# Patient Record
Sex: Female | Born: 1935 | Smoking: Never smoker
Health system: Southern US, Community
[De-identification: ages and names within clinical notes are randomized; demographics above are authoritative.]

## PROBLEM LIST (undated history)

## (undated) DIAGNOSIS — E079 Disorder of thyroid, unspecified: Secondary | ICD-10-CM

## (undated) DIAGNOSIS — E785 Hyperlipidemia, unspecified: Secondary | ICD-10-CM

## (undated) DIAGNOSIS — E119 Type 2 diabetes mellitus without complications: Secondary | ICD-10-CM

## (undated) DIAGNOSIS — D649 Anemia, unspecified: Secondary | ICD-10-CM

## (undated) DIAGNOSIS — Z950 Presence of cardiac pacemaker: Secondary | ICD-10-CM

## (undated) DIAGNOSIS — R569 Unspecified convulsions: Secondary | ICD-10-CM

## (undated) DIAGNOSIS — I442 Atrioventricular block, complete: Secondary | ICD-10-CM

## (undated) DIAGNOSIS — I10 Essential (primary) hypertension: Secondary | ICD-10-CM

## (undated) HISTORY — DX: Presence of cardiac pacemaker: Z95.0

## (undated) HISTORY — PX: KNEE SURGERY: SHX244

## (undated) HISTORY — DX: Atrioventricular block, complete: I44.2

## (undated) HISTORY — PX: HIP SURGERY: SHX245

---

## 2011-03-07 ENCOUNTER — Inpatient Hospital Stay: Payer: Self-pay | Admitting: Internal Medicine

## 2011-03-08 DIAGNOSIS — I369 Nonrheumatic tricuspid valve disorder, unspecified: Secondary | ICD-10-CM

## 2013-05-15 ENCOUNTER — Ambulatory Visit: Payer: Self-pay | Admitting: Student

## 2013-05-15 ENCOUNTER — Inpatient Hospital Stay: Payer: Self-pay | Admitting: Internal Medicine

## 2013-05-15 LAB — T4, FREE: Free Thyroxine: 1.4 ng/dL (ref 0.76–1.46)

## 2013-05-15 LAB — BASIC METABOLIC PANEL
Chloride: 100 mmol/L (ref 98–107)
Co2: 27 mmol/L (ref 21–32)
Creatinine: 0.98 mg/dL (ref 0.60–1.30)
EGFR (African American): 60 — ABNORMAL LOW
Glucose: 128 mg/dL — ABNORMAL HIGH (ref 65–99)
Osmolality: 272 (ref 275–301)
Potassium: 4.1 mmol/L (ref 3.5–5.1)
Sodium: 133 mmol/L — ABNORMAL LOW (ref 136–145)

## 2013-05-15 LAB — APTT: Activated PTT: 33 secs (ref 23.6–35.9)

## 2013-05-15 LAB — MAGNESIUM: Magnesium: 1.5 mg/dL — ABNORMAL LOW

## 2013-05-15 LAB — URINALYSIS, COMPLETE
Blood: NEGATIVE
Glucose,UR: NEGATIVE mg/dL (ref 0–75)
Leukocyte Esterase: NEGATIVE
Ph: 6 (ref 4.5–8.0)
Squamous Epithelial: 1

## 2013-05-15 LAB — CBC
HGB: 11.9 g/dL — ABNORMAL LOW (ref 12.0–16.0)
MCH: 31.8 pg (ref 26.0–34.0)
Platelet: 264 10*3/uL (ref 150–440)
RDW: 12.2 % (ref 11.5–14.5)
WBC: 14.1 10*3/uL — ABNORMAL HIGH (ref 3.6–11.0)

## 2013-05-15 LAB — TSH: Thyroid Stimulating Horm: 2.34 u[IU]/mL

## 2013-05-15 LAB — PROTIME-INR
INR: 1
Prothrombin Time: 13.1 secs (ref 11.5–14.7)

## 2013-05-15 LAB — PHOSPHORUS: Phosphorus: 3.1 mg/dL (ref 2.5–4.9)

## 2013-05-15 LAB — SEDIMENTATION RATE: Erythrocyte Sed Rate: 86 mm/hr — ABNORMAL HIGH (ref 0–30)

## 2013-05-16 LAB — CBC WITH DIFFERENTIAL/PLATELET
Basophil #: 0.1 10*3/uL (ref 0.0–0.1)
Eosinophil #: 0.1 10*3/uL (ref 0.0–0.7)
HGB: 11.5 g/dL — ABNORMAL LOW (ref 12.0–16.0)
Lymphocyte %: 14.8 %
MCH: 32.5 pg (ref 26.0–34.0)
MCV: 96 fL (ref 80–100)
Monocyte #: 0.8 x10 3/mm (ref 0.2–0.9)
Monocyte %: 6.7 %
Neutrophil %: 76.7 %
Platelet: 271 10*3/uL (ref 150–440)
RBC: 3.54 10*6/uL — ABNORMAL LOW (ref 3.80–5.20)
RDW: 12.1 % (ref 11.5–14.5)

## 2013-05-16 LAB — BASIC METABOLIC PANEL
Anion Gap: 4 — ABNORMAL LOW (ref 7–16)
Calcium, Total: 8.9 mg/dL (ref 8.5–10.1)
EGFR (African American): 58 — ABNORMAL LOW
Glucose: 140 mg/dL — ABNORMAL HIGH (ref 65–99)
Osmolality: 271 (ref 275–301)
Sodium: 133 mmol/L — ABNORMAL LOW (ref 136–145)

## 2013-05-16 LAB — TROPONIN I: Troponin-I: <0.02 ng/mL

## 2013-05-16 LAB — LIPID PANEL
Cholesterol: 184 mg/dL (ref 0–200)
Triglycerides: 65 mg/dL (ref 0–200)

## 2013-05-16 LAB — MAGNESIUM: Magnesium: 1.8 mg/dL

## 2013-05-16 LAB — HEMATOCRIT: HCT: 26.2 % — ABNORMAL LOW (ref 35.0–47.0)

## 2013-05-17 LAB — TROPONIN I: Troponin-I: <0.02 ng/mL

## 2013-05-17 LAB — CBC WITH DIFFERENTIAL/PLATELET
Basophil #: 0 10*3/uL (ref 0.0–0.1)
Eosinophil #: 0.4 10*3/uL (ref 0.0–0.7)
Eosinophil %: 3.3 %
HCT: 23 % — ABNORMAL LOW (ref 35.0–47.0)
Lymphocyte %: 16.9 %
MCH: 33.4 pg (ref 26.0–34.0)
MCV: 97 fL (ref 80–100)
Monocyte #: 0.8 x10 3/mm (ref 0.2–0.9)
Monocyte %: 7.7 %
Neutrophil #: 7.7 10*3/uL — ABNORMAL HIGH (ref 1.4–6.5)
RBC: 2.38 10*6/uL — ABNORMAL LOW (ref 3.80–5.20)
RDW: 12.2 % (ref 11.5–14.5)
WBC: 10.7 10*3/uL (ref 3.6–11.0)

## 2013-05-17 LAB — BASIC METABOLIC PANEL
Calcium, Total: 8 mg/dL — ABNORMAL LOW (ref 8.5–10.1)
Chloride: 103 mmol/L (ref 98–107)
Co2: 25 mmol/L (ref 21–32)
Creatinine: 1.08 mg/dL (ref 0.60–1.30)
EGFR (Non-African Amer.): 46 — ABNORMAL LOW
Potassium: 4 mmol/L (ref 3.5–5.1)
Sodium: 134 mmol/L — ABNORMAL LOW (ref 136–145)

## 2013-05-18 LAB — BASIC METABOLIC PANEL
BUN: 19 mg/dL — ABNORMAL HIGH (ref 7–18)
Calcium, Total: 7.8 mg/dL — ABNORMAL LOW (ref 8.5–10.1)
Chloride: 104 mmol/L (ref 98–107)
Creatinine: 1.04 mg/dL (ref 0.60–1.30)
Glucose: 126 mg/dL — ABNORMAL HIGH (ref 65–99)
Osmolality: 270 (ref 275–301)
Potassium: 4 mmol/L (ref 3.5–5.1)

## 2013-05-18 LAB — PLATELET COUNT: Platelet: 178 10*3/uL (ref 150–440)

## 2013-05-18 LAB — HEMOGLOBIN
HGB: 6.7 g/dL — ABNORMAL LOW (ref 12.0–16.0)
HGB: 9 g/dL — ABNORMAL LOW (ref 12.0–16.0)

## 2013-05-19 LAB — BASIC METABOLIC PANEL
Anion Gap: 5 — ABNORMAL LOW (ref 7–16)
BUN: 13 mg/dL (ref 7–18)
Calcium, Total: 8.2 mg/dL — ABNORMAL LOW (ref 8.5–10.1)
Chloride: 103 mmol/L (ref 98–107)
Co2: 26 mmol/L (ref 21–32)
Creatinine: 0.97 mg/dL (ref 0.60–1.30)
EGFR (African American): >60
EGFR (Non-African Amer.): 52 — ABNORMAL LOW
Glucose: 150 mg/dL — ABNORMAL HIGH (ref 65–99)
Osmolality: 271 (ref 275–301)
Potassium: 3.6 mmol/L (ref 3.5–5.1)
Sodium: 134 mmol/L — ABNORMAL LOW (ref 136–145)

## 2013-05-19 LAB — PLATELET COUNT: Platelet: 196 10*3/uL (ref 150–440)

## 2013-05-19 LAB — HEMOGLOBIN: HGB: 8.5 g/dL — ABNORMAL LOW (ref 12.0–16.0)

## 2013-05-20 LAB — CBC WITH DIFFERENTIAL/PLATELET
BASOS ABS: 0 10*3/uL (ref 0.0–0.1)
Basophil %: 0.2 %
Eosinophil #: 0.4 10*3/uL (ref 0.0–0.7)
Eosinophil %: 3.9 %
HCT: 23.8 % — AB (ref 35.0–47.0)
HGB: 8.2 g/dL — AB (ref 12.0–16.0)
LYMPHS PCT: 16.1 %
Lymphocyte #: 1.7 10*3/uL (ref 1.0–3.6)
MCH: 31.4 pg (ref 26.0–34.0)
MCHC: 34.6 g/dL (ref 32.0–36.0)
MCV: 91 fL (ref 80–100)
MONO ABS: 0.8 x10 3/mm (ref 0.2–0.9)
Monocyte %: 8 %
NEUTROS PCT: 71.8 %
Neutrophil #: 7.5 10*3/uL — ABNORMAL HIGH (ref 1.4–6.5)
PLATELETS: 217 10*3/uL (ref 150–440)
RBC: 2.62 10*6/uL — ABNORMAL LOW (ref 3.80–5.20)
RDW: 13.3 % (ref 11.5–14.5)
WBC: 10.4 10*3/uL (ref 3.6–11.0)

## 2013-05-20 LAB — URINALYSIS, COMPLETE
Bacteria: NONE SEEN
Bilirubin,UR: NEGATIVE
Blood: NEGATIVE
Glucose,UR: 50 mg/dL (ref 0–75)
KETONE: NEGATIVE
Leukocyte Esterase: NEGATIVE
Nitrite: NEGATIVE
PROTEIN: NEGATIVE
Ph: 6 (ref 4.5–8.0)
Specific Gravity: 1.006 (ref 1.003–1.030)
Squamous Epithelial: 1
WBC UR: 1 /HPF (ref 0–5)

## 2013-05-20 LAB — BASIC METABOLIC PANEL
ANION GAP: 5 — AB (ref 7–16)
BUN: 13 mg/dL (ref 7–18)
CHLORIDE: 100 mmol/L (ref 98–107)
CO2: 29 mmol/L (ref 21–32)
Calcium, Total: 8.5 mg/dL (ref 8.5–10.1)
Creatinine: 0.89 mg/dL (ref 0.60–1.30)
GFR CALC NON AF AMER: 58 — AB
GLUCOSE: 153 mg/dL — AB (ref 65–99)
OSMOLALITY: 271 (ref 275–301)
POTASSIUM: 3.7 mmol/L (ref 3.5–5.1)
SODIUM: 134 mmol/L — AB (ref 136–145)

## 2013-05-20 LAB — PATHOLOGY REPORT

## 2013-05-25 ENCOUNTER — Inpatient Hospital Stay: Payer: Self-pay | Admitting: Internal Medicine

## 2013-05-25 LAB — COMPREHENSIVE METABOLIC PANEL
ALT: 46 U/L (ref 12–78)
Albumin: 2.3 g/dL — ABNORMAL LOW (ref 3.4–5.0)
Alkaline Phosphatase: 132 U/L — ABNORMAL HIGH
Anion Gap: 8 (ref 7–16)
BUN: 20 mg/dL — ABNORMAL HIGH (ref 7–18)
Bilirubin,Total: 0.7 mg/dL (ref 0.2–1.0)
CALCIUM: 8.4 mg/dL — AB (ref 8.5–10.1)
Chloride: 93 mmol/L — ABNORMAL LOW (ref 98–107)
Co2: 26 mmol/L (ref 21–32)
Creatinine: 1.08 mg/dL (ref 0.60–1.30)
EGFR (African American): 53 — ABNORMAL LOW
EGFR (Non-African Amer.): 46 — ABNORMAL LOW
Glucose: 97 mg/dL (ref 65–99)
Osmolality: 258 (ref 275–301)
Potassium: 3.7 mmol/L (ref 3.5–5.1)
SGOT(AST): 75 U/L — ABNORMAL HIGH (ref 15–37)
SODIUM: 127 mmol/L — AB (ref 136–145)
Total Protein: 7.2 g/dL (ref 6.4–8.2)

## 2013-05-25 LAB — URINALYSIS, COMPLETE
BILIRUBIN, UR: NEGATIVE
GLUCOSE, UR: NEGATIVE mg/dL (ref 0–75)
KETONE: NEGATIVE
Nitrite: NEGATIVE
PROTEIN: NEGATIVE
Ph: 5 (ref 4.5–8.0)
SPECIFIC GRAVITY: 1.014 (ref 1.003–1.030)
Squamous Epithelial: 10

## 2013-05-25 LAB — TROPONIN I

## 2013-05-25 LAB — CBC
HCT: 25.5 % — AB (ref 35.0–47.0)
HGB: 8.7 g/dL — AB (ref 12.0–16.0)
MCH: 31 pg (ref 26.0–34.0)
MCHC: 34.2 g/dL (ref 32.0–36.0)
MCV: 91 fL (ref 80–100)
Platelet: 439 10*3/uL (ref 150–440)
RBC: 2.81 10*6/uL — ABNORMAL LOW (ref 3.80–5.20)
RDW: 13.2 % (ref 11.5–14.5)
WBC: 7 10*3/uL (ref 3.6–11.0)

## 2013-05-25 LAB — RAPID INFLUENZA A&B ANTIGENS

## 2013-05-25 LAB — LIPASE, BLOOD: LIPASE: 772 U/L — AB (ref 73–393)

## 2013-05-26 LAB — LIPID PANEL
CHOLESTEROL: 121 mg/dL (ref 0–200)
HDL Cholesterol: 26 mg/dL — ABNORMAL LOW (ref 40–60)
Ldl Cholesterol, Calc: 62 mg/dL (ref 0–100)
TRIGLYCERIDES: 163 mg/dL (ref 0–200)
VLDL CHOLESTEROL, CALC: 33 mg/dL (ref 5–40)

## 2013-05-26 LAB — BASIC METABOLIC PANEL
Anion Gap: 4 — ABNORMAL LOW (ref 7–16)
BUN: 22 mg/dL — AB (ref 7–18)
CREATININE: 1.29 mg/dL (ref 0.60–1.30)
Calcium, Total: 8.5 mg/dL (ref 8.5–10.1)
Chloride: 95 mmol/L — ABNORMAL LOW (ref 98–107)
Co2: 30 mmol/L (ref 21–32)
EGFR (African American): 43 — ABNORMAL LOW
GFR CALC NON AF AMER: 37 — AB
GLUCOSE: 92 mg/dL (ref 65–99)
Osmolality: 262 (ref 275–301)
POTASSIUM: 3.9 mmol/L (ref 3.5–5.1)
SODIUM: 129 mmol/L — AB (ref 136–145)

## 2013-05-26 LAB — MAGNESIUM: MAGNESIUM: 1.4 mg/dL — AB

## 2013-05-27 LAB — CBC WITH DIFFERENTIAL/PLATELET
Basophil #: 0 10*3/uL (ref 0.0–0.1)
Basophil %: 0.7 %
EOS ABS: 0.2 10*3/uL (ref 0.0–0.7)
Eosinophil %: 2.9 %
HCT: 25.1 % — ABNORMAL LOW (ref 35.0–47.0)
HGB: 8.6 g/dL — AB (ref 12.0–16.0)
Lymphocyte #: 1.5 10*3/uL (ref 1.0–3.6)
Lymphocyte %: 24.3 %
MCH: 31.2 pg (ref 26.0–34.0)
MCHC: 34.3 g/dL (ref 32.0–36.0)
MCV: 91 fL (ref 80–100)
Monocyte #: 0.6 x10 3/mm (ref 0.2–0.9)
Monocyte %: 10.3 %
Neutrophil #: 3.8 10*3/uL (ref 1.4–6.5)
Neutrophil %: 61.8 %
Platelet: 441 10*3/uL — ABNORMAL HIGH (ref 150–440)
RBC: 2.75 10*6/uL — ABNORMAL LOW (ref 3.80–5.20)
RDW: 13.7 % (ref 11.5–14.5)
WBC: 6.1 10*3/uL (ref 3.6–11.0)

## 2013-05-27 LAB — COMPREHENSIVE METABOLIC PANEL
ALK PHOS: 103 U/L
ALT: 36 U/L (ref 12–78)
ANION GAP: 5 — AB (ref 7–16)
Albumin: 2.3 g/dL — ABNORMAL LOW (ref 3.4–5.0)
BUN: 21 mg/dL — AB (ref 7–18)
Bilirubin,Total: 0.6 mg/dL (ref 0.2–1.0)
CHLORIDE: 98 mmol/L (ref 98–107)
CREATININE: 1 mg/dL (ref 0.60–1.30)
Calcium, Total: 8.3 mg/dL — ABNORMAL LOW (ref 8.5–10.1)
Co2: 26 mmol/L (ref 21–32)
EGFR (Non-African Amer.): 50 — ABNORMAL LOW
GFR CALC AF AMER: 58 — AB
Glucose: 80 mg/dL (ref 65–99)
Osmolality: 261 (ref 275–301)
POTASSIUM: 3.7 mmol/L (ref 3.5–5.1)
SGOT(AST): 46 U/L — ABNORMAL HIGH (ref 15–37)
SODIUM: 129 mmol/L — AB (ref 136–145)
Total Protein: 6.8 g/dL (ref 6.4–8.2)

## 2013-05-27 LAB — MAGNESIUM: Magnesium: 1.9 mg/dL

## 2013-05-27 LAB — LIPASE, BLOOD: LIPASE: 950 U/L — AB (ref 73–393)

## 2013-05-29 LAB — BASIC METABOLIC PANEL
Anion Gap: 7 (ref 7–16)
BUN: 12 mg/dL (ref 7–18)
Calcium, Total: 7.7 mg/dL — ABNORMAL LOW (ref 8.5–10.1)
Chloride: 101 mmol/L (ref 98–107)
Co2: 23 mmol/L (ref 21–32)
Creatinine: 0.89 mg/dL (ref 0.60–1.30)
EGFR (African American): >60
EGFR (Non-African Amer.): 58 — ABNORMAL LOW
Glucose: 113 mg/dL — ABNORMAL HIGH (ref 65–99)
Osmolality: 263 (ref 275–301)
Potassium: 3.9 mmol/L (ref 3.5–5.1)
Sodium: 131 mmol/L — ABNORMAL LOW (ref 136–145)

## 2013-08-03 ENCOUNTER — Ambulatory Visit: Payer: Self-pay | Admitting: Unknown Physician Specialty

## 2013-08-05 ENCOUNTER — Ambulatory Visit: Payer: Self-pay | Admitting: Unknown Physician Specialty

## 2013-09-17 ENCOUNTER — Emergency Department: Payer: Self-pay | Admitting: Internal Medicine

## 2013-09-17 LAB — URINALYSIS, COMPLETE
BILIRUBIN, UR: NEGATIVE
Bacteria: NONE SEEN
Glucose,UR: NEGATIVE mg/dL (ref 0–75)
Ketone: NEGATIVE
Leukocyte Esterase: NEGATIVE
NITRITE: NEGATIVE
PH: 6 (ref 4.5–8.0)
Protein: NEGATIVE
Specific Gravity: 1.014 (ref 1.003–1.030)
Squamous Epithelial: 1
WBC UR: 2 /HPF (ref 0–5)

## 2013-09-18 LAB — URINE CULTURE

## 2013-10-05 DIAGNOSIS — E039 Hypothyroidism, unspecified: Secondary | ICD-10-CM | POA: Insufficient documentation

## 2013-10-05 DIAGNOSIS — M3390 Dermatopolymyositis, unspecified, organ involvement unspecified: Secondary | ICD-10-CM | POA: Insufficient documentation

## 2013-10-05 DIAGNOSIS — M3313 Other dermatomyositis without myopathy: Secondary | ICD-10-CM | POA: Insufficient documentation

## 2013-10-05 DIAGNOSIS — M1990 Unspecified osteoarthritis, unspecified site: Secondary | ICD-10-CM | POA: Insufficient documentation

## 2014-09-08 NOTE — Consult Note (Signed)
Admit Diagnosis:   FRACTURE OF HIP: Onset Date: 15-May-2013, Status: Active, Description: FRACTURE OF HIP    Hypothyroidism:    Diabetes:   Home Medications: Medication Instructions Status  levothyroxine 50 mcg (0.05 mg) oral tablet 1 tab(s) orally once a day Active   Lab Results: Thyroid:  28-Dec-14 13:47   Thyroid Stimulating Hormone 2.34 (0.45-4.50 (International Unit)  ----------------------- Pregnant patients have  different reference  ranges for TSH:  - - - - - - - - - -  Pregnant, first trimetser:  0.36 - 2.50 uIU/mL)  Thyroxine, Free 1.40 (Result(s) reported on 15 May 2013 at 03:08PM.)  Routine Chem:  28-Dec-14 13:47   BUN  25  Creatinine (comp) 0.98  Sodium, Serum  133  Potassium, Serum 4.1  Chloride, Serum 100  CO2, Serum 27  Calcium (Total), Serum 9.3  Anion Gap  6  Osmolality (calc) 272  eGFR (African American)  60  eGFR (Non-African American)  52 (eGFR values <20m/min/1.73 m2 may be an indication of chronic kidney disease (CKD). Calculated eGFR is useful in patients with stable renal function. The eGFR calculation will not be reliable in acutely ill patients when serum creatinine is changing rapidly. It is not useful in  patients on dialysis. The eGFR calculation may not be applicable to patients at the low and high extremes of body sizes, pregnant women, and vegetarians.)  Result Comment POTASSIUM/AST - Slight hemolysis, interpret results with  - caution.  Result(s) reported on 15 May 2013 at 03:08PM.  Phosphorus, Serum 3.1 (Result(s) reported on 15 May 2013 at 03:08PM.)  Magnesium, Serum  1.5 (1.8-2.4 THERAPEUTIC RANGE: 4-7 mg/dL TOXIC: > 10 mg/dL  -----------------------)  Routine UA:  28-Dec-14 13:47   Color (UA) Yellow  Clarity (UA) Clear  Glucose (UA) Negative  Bilirubin (UA) Negative  Ketones (UA) Negative  Specific Gravity (UA) 1.011  Blood (UA) Negative  pH (UA) 6.0  Protein (UA) Negative  Nitrite (UA) Negative  Leukocyte  Esterase (UA) Negative (Result(s) reported on 15 May 2013 at 02:36PM.)  RBC (UA) 2 /HPF  WBC (UA) 2 /HPF  Bacteria (UA) NONE SEEN  Epithelial Cells (UA) 1 /HPF (Result(s) reported on 15 May 2013 at 02:36PM.)  Routine Coag:  28-Dec-14 13:47   Prothrombin 13.1  INR 1.0 (INR reference interval applies to patients on anticoagulant therapy. A single INR therapeutic range for coumarins is not optimal for all indications; however, the suggested range for most indications is 2.0 - 3.0. Exceptions to the INR Reference Range may include: Prosthetic heart valves, acute myocardial infarction, prevention of myocardial infarction, and combinations of aspirin and anticoagulant. The need for a higher or lower target INR must be assessed individually. Reference: The Pharmacology and Management of the Vitamin K  antagonists: the seventh ACCP Conference on Antithrombotic and Thrombolytic Therapy. CWGYKZ.9935Sept:126 (3suppl): [id number] A HCT value >55% may artifactually increase the PT.  In one study,  the increase was an average of 25%. Reference:  "Effect on Routine and Special Coagulation Testing Values of Citrate Anticoagulant Adjustment in Patients with High HCT Values." American Journal of Clinical Pathology 2006;126:400-405.)  Activated PTT (APTT) 33.0 (A HCT value >55% may artifactually increase the APTT. In one study, the increase was an average of 19%. Reference: "Effect on Routine and Special Coagulation Testing Values of Citrate Anticoagulant Adjustment in Patients with High HCT Values." American Journal of Clinical Pathology 2006;126:400-405.)  Routine Hem:  28-Dec-14 13:47   WBC (CBC)  14.1  RBC (CBC)  3.74  Hemoglobin (CBC)  11.9  Hematocrit (CBC) 35.6  Platelet Count (CBC) 264 (Result(s) reported on 15 May 2013 at 02:48PM.)  MCV 95  MCH 31.8  MCHC 33.4  RDW 12.2  Erythrocyte Sed Rate  86 (Result(s) reported on 15 May 2013 at 03:48PM.)   Radiology Results:  Radiology  Results: XRay:    28-Dec-14 15:07, Femur Right  Femur Right  REASON FOR EXAM:    RLE pain, tender  COMMENTS:       PROCEDURE: DXR - DXR FEMUR RIGHT  - May 15 2013  3:07PM     CLINICAL DATA:  Right hip pain, no trauma    EXAM:  RIGHT FEMUR - 2 VIEW    COMPARISON:  Pelvic radiograph same date    FINDINGS:  Acutemildly angulated right femoral neck fracture is reidentified.  Degenerative changes are noted at the right knee. No other right  femoral fracture is identified. Femoral head appears properly  located. Vascular calcifications are noted.     IMPRESSION:  Acute right femoral neck fracture.      Electronically Signed    By: Conchita Paris M.D.    On: 05/15/2013 15:22         Verified By: Arline Asp, M.D.,    28-Dec-14 15:07, Pelvis AP Only  Pelvis AP Only  REASON FOR EXAM:    right hip pain  COMMENTS:       PROCEDURE: DXR - DXR PELVIS AP ONLY  - May 15 2013  3:07PM     CLINICAL DATA:  Right hip pain, no trauma    EXAM:  PELVIS - 1-2 VIEW    COMPARISON:  CT 03/07/11    FINDINGS:  There is an acute rightfemoral neck fracture with minimal  angulation. Mild deformity of the right superior and inferior pubic  rami appears stable. Vascular calcifications are noted. Femoral  heads appear properly located allowing for technique.     IMPRESSION:  Acute right femoral neck fracture.      Electronically Signed    By: Conchita Paris M.D.    On: 05/15/2013 15:22         Verified By: Arline Asp, M.D.,    28-Dec-14 15:07, Tibia And Fibula Right  Tibia And Fibula Right  REASON FOR EXAM:    RLE pain, tender  COMMENTS:       PROCEDURE: DXR - DXR TIBIA AND FIBULA RT (LOWER L  - May 15 2013  3:07PM     CLINICAL DATA:  Right lower extremity pain, no trauma    EXAM:  RIGHT TIBIA AND FIBULA - 2 VIEW    COMPARISON:  Right hip radiographs same date, please see dedicated  report    FINDINGS:  Tricompartmental degenerative change within the is  noted. No long  bone fracture of the tibia or fibula is identified. Vascular  calcifications are noted. No radiopaque foreign body.    IMPRESSION:  No tibial or fibular fracture.      Electronically Signed    By: Conchita Paris M.D.    On: 05/15/2013 15:23         Verified By: Arline Asp, M.D.,    No Known Allergies:   Nursing Flowsheets: **Vital Signs.:   28-Dec-14 19:40  Vital Signs Type Admission  Temperature Temperature (F) 99.7  Celsius 37.6  Temperature Source oral  Pulse Pulse 89  Respirations Respirations 18  Systolic BP Systolic BP 270  Diastolic BP (mmHg) Diastolic BP (mmHg) 65  Mean BP 83  Pulse  Ox % Pulse Ox % 95  Pulse Ox Activity Level  At rest  Oxygen Delivery Room Air/ 21 %    General Aspect 81 y/o pleasant female c/o of right hip pain.   Present Illness 81 y/o female who is c/o right hip pain for past 3-5 days. She had some baseline hip pain but not as severe. She is no unable to bear weight. Ambulates with walker at baseline. Has bilateral knee arthritis for which she receives shots by Dr. Jefm Bryant in [place]. There is no traumatic event associated with new onset of hip pain which she describes as sharp innature. Requires assistance for transfers since hip pain started. She speaks with heavy accent, understand everything and has daughters at bedside that help with communication.   Case History and Physical Exam:  Chief Complaint right hip pain   Past Medical Health Diabetes Mellitus, new onset Afib, hypothyroidism, gout, chronic anemia, ?blood clot versus carotid stenosis   Past Surgical History None   Family History Diabetes Mellitus   HEENT head atraumatic, EOM intact, hearing intact   Neck/Nodes Supple  No Adenopathy   Chest/Lungs non-labored breathing, no accessory muscle use   Breasts Not examined   Cardiovascular Irregular Rate and Rhythm   Abdomen Benign   Genitalia Not examined   Rectal Not examined   Musculoskeletal  RLE: Extremity WWP, SILT  DP/SP/sural/saphenous, EHL/FHL/TA/GCS intact. Hip ROM with severe pain, keeps extremity in external rotation and slight flexion. No open wounds, no cellulitis.   Neurological Grossly WNL   Skin Warm  WNL    Impression Right subcapital femoral neck fracture   Plan Patient has no onset Afib, no documentation or priorcardiac workup since [place]. Will need Cardiac clearance in AM prior to surgery. Consented for surgery. If clear will post with Dr. Marry Guan in AM.   - NWB RLE - pain control - hold chemical DVT ppx day of surgery - SCD and TED hose   Electronic Signatures: Harle Battiest (MD)  (Signed 28-Dec-14 20:54)  Authored: Health Issues, Significant Events - History, Home Medications, Labs, Radiology Results, Allergies, Vital Signs, General Aspect/Present Illness, History and Physical Exam, Impression/Plan   Last Updated: 28-Dec-14 20:54 by Harle Battiest (MD)

## 2014-09-08 NOTE — Consult Note (Signed)
CTSP with telemetry with 3 and 4 second pauses; pt with abrupt onset dizziness, nausea, appears pale but communicative.  BP initially 89 systolic, improved with IVF.  Will need to move to CCU in event needs TCP; give ivf bolus and has nausea meds to use PRN.  Consider dopamine if BP begins to fall again.  Off all rate limiting meds already.  Saw cardiology earlier today; will discuss with them.  cycle troponins and get stat hct  Electronic Signatures: Curtis SitesKlein, Beth Goodlin J (MD)  (Signed on 29-Dec-14 18:42)  Authored  Last Updated: 29-Dec-14 18:42 by Curtis SitesKlein, Jolicia Delira J (MD)

## 2014-09-08 NOTE — Consult Note (Signed)
Brief Consult Note: Diagnosis: Pre-op, AF with controlled rate. Echo shows normal LVF, mod TR, no other significant findings. Acceptable risk for hip surgery.   Patient was seen by consultant.   Consult note dictated.   Comments: REC  Agree with current therapy, defer further cardiac diagnostics, proceed with hip surgery.  Electronic Signatures: Marcina MillardParaschos, Hayle Parisi (MD)  (Signed 29-Dec-14 08:35)  Authored: Brief Consult Note   Last Updated: 29-Dec-14 08:35 by Marcina MillardParaschos, Tychelle Purkey (MD)

## 2014-09-08 NOTE — H&P (Signed)
PATIENT NAME:  Diana Braun, Diana Braun MR#:  [id number] DATE OF BIRTH:  Jul 20, 1922  DATE OF ADMISSION:  05/15/2013  PRIMARY CARE PHYSICIAN: Dr. Marcello FennelHande  REASON FOR ADMISSION:  Hip pain, leg pain, diagnosed with a hip fracture.   REFERRING PHYSICIAN: Dr. Scotty CourtStafford  HISTORY OF PRESENT ILLNESS: This is a very nice 81 year old female, who has history of diabetes, hypothyroidism, anemia, hypokalemia, gout, and chronic swelling of the lower extremities.  [place] and she lives there several months a year, and she comes back here the other half of the year and spends some time with her daughter. The last time she came back from [place] was in October. [place] she has a doctor, and the doctor has been treating her for her diabetes, hypothyroidism, etc. She has never been diagnosed with any heart conditions, although in the past she was told that there was a blood clot somewhere in her hear. She never got treated, never got any follow up, so she is uncertain of this information be accurate.   The patient comes today with a history of 3 days of hip pain. Apparently this is something that has been going on for much longer than that. She has been hurting in her knees for over 1 or 2 weeks. She states that back in October, she had an incident when she was pushing some boxes, and she hit her hip on that side, but since then she did not have any major problems, other than  some soreness. For the last 3 days she is having severe pain, 10 out of 10 in intensity, very bad, radiating to the knee on the right side. Complaining of some swelling of the lower extremities, and patient not being able to walk. Prior to that, she had been walking with a walker without major problems. The patient actually used to be very active. She used to work for [employer] in a [employer], and she was on her feet all her life.   At this moment, the patient has significant pain, and she is going to be  evaluated by Orthopedics for possible surgical procedure. The patient was found to be in atrial fibrillation, rate controlled. She was not aware of this. She was not taking any medications for it, for what it is going to be a difficult clearance until we have at least an echocardiogram to evaluate the contractility of her heart. She denies any chest pain with activity or shortness of breath with activity as an indication of angina. The patient is already taking a beta blocker, atenolol apparently, for what I am going to continue that medication along the surgical to prevent any cardiac complications for surgery. [facility] am [facility] to evaluate the patient in the morning, and to get a stat echocardiogram in the morning prior to proceeding with the surgical procedure.   REVIEW OF SYSTEMS: CONSTITUTIONAL: No fever, weight loss or weight gain.  EYES: No blurry vision, double vision.  EARS, NOSE, THROAT: No difficulty swallowing or tinnitus.  RESPIRATORY: No shortness of breath, cough, wheezing, hemoptysis.  CARDIOVASCULAR: No chest pain. No orthopnea. No arrhythmias. Positive edema of the lower extremities. No palpitations. No syncope.  GASTROINTESTINAL: No nausea, vomiting, abdominal pain, constipation, diarrhea.  GENITOURINARY: No dysuria, hematuria, changes in frequency.  GYNECOLOGIC: No breast masses.  ENDOCRINE: No polyuria, polydipsia, polyphagia, cold or heat intolerance.  HEMATOLOGIC AND LYMPHATIC: No anemia, easy bruising or bleeding.  SKIN: No rashes or petechiae.  NEUROLOGIC: No numbness, tingling or ataxia.  PSYCHIATRIC: No insomnia or nervousness.  MUSCULOSKELETAL: The patient has severe osteoarthritis of the knees, and she has been seeing Dr. Gavin Potters for this, and they are trying to make a decision about getting a knee replacement.   PAST MEDICAL HISTORY: 1.  Hypothyroidism.  2.  Chronic disease anemia.  3.  Chronic swelling of the lower extremities.  4.   Noninsulin-dependent diabetes.  5.  Gout.   PAST SURGICAL HISTORY:  Cataract removal.   ALLERGIES: No known drug allergies.   FAMILY HISTORY: The patient is not aware of any coronary artery disease, arrhythmias, or CHF in her family. No MI. No cancer. Her father died in an accident at a young age. Her mother and 2 brothers have diabetes.   SOCIAL HISTORY: The patient used to work for [employer] in [employer], always on her feet. She never smoked. Does not drink. Does not use any drugs. She moves back and forth from [place] with [place] family, she states 6 months here, 6 months there. The last time she came back was in October.   CURRENT MEDICATIONS: Aspirin once daily, unknown if 325 mg or 81 mg, lisinopril 5 mg once daily, metformin 500 mg twice daily, allopurinol 100 mg once daily, atenolol 25 mg once daily, ferrous sulfate 325 mg daily, Protonix 40 mg daily, Levothyroxine 50 mcg daily.   PHYSICAL EXAMINATION: VITAL SIGNS: Blood pressure 142/64, pulse 82, respirations 18, temperature 99, oxygen saturation 99% on room air.  GENERAL: The patient is alert, oriented x 3, in no acute distress. No respiratory distress. She speaks English with a heavy accident, but she understands most of what we are talking. Her daughter is here to help her with communication, although she does not need any help.  HEENT: Her pupils are equal and reactive. Extraocular movements are intact. Mucosa are dry. Anicteric sclerae. Pink conjunctivae. No oral lesions. No oropharyngeal exudates.  NECK: Supple. No JVD. No thyromegaly. No adenopathy. No carotid bruits. No rigidity.  CARDIOVASCULAR: Irregularly irregular, rate controlled atrial fibrillation. No murmurs, rubs or gallops. No displacement of PMI. No tenderness to palpation of the chest.  LUNGS: Clear, without any wheezing or crepitus. No use of accessory muscles.  ABDOMEN: Soft, nontender, nondistended. No hepatosplenomegaly. No masses. Bowel  sounds are positive.  EXTREMITIES:  Positive trace edema of the lower extremities. Pulses +2. Capillary refill less than 3.  SKIN: No rashes or petechiae.  VASCULAR: Pulses +2. Capillary refill less than 3.  LYMPHATIC: Negative for lymphadenopathy in neck or supraclavicular areas.  MUSCULOSKELETAL: The patient has external rotation of the right lower extremity. The patient also has significant tenderness at the level of the left hip. No significant deformity. No significant ecchymosis.  NEUROLOGIC: Cranial nerves II to XII intact. Strength is 5/5 upper extremities, 5/5 of left lower extremity, unable to move the right lower extremity due to her fracture.  PSYCHIATRIC: Significant for patient being alert, oriented x 3. No significant altered mental status or agitation. The patient is very cooperative.   GENITAL:  Exam negative for external lesions.  DATA: Glucose is 128, BUN 25, sodium is 133, potassium 4.1, magnesium 1.5. TSH 2.34. White count is 14,000, her hemoglobin is 11.9, platelet count 264. Urinalysis negative for urinary tract infection. INR is 1.   Her x-rays show an acute right femoral neck fracture. Chest x-ray showed mild cardiomegaly, prominence of interstitial markings, stable and nonspecific. EKG: Atrial fibrillation, no ST depression or elevation, rate  controlled, right bundle branch block, anterior fascicular block. We do not have any previous EKGs to compare.   ASSESSMENT AND PLAN: This is a very nice 81 year old female with history of hip fracture, diabetes, hypertension, hyperlipidemia, now new diagnosis atrial fibrillation.   1.  Hip fracture. The patient is admitted for evaluation of hip fracture. At this moment, I cannot clear her up for surgery because of the new diagnosis of atrial fibrillation. We do not know how long this has been going on. We do not have any history of the patient having this problem in the past. The family is not aware of that. She sees Dr. Marcello Fennel here in  the [place], and he has never mentioned anything, for what we are going to get a Cardiology evaluation and an echocardiogram in the morning. I do not think that she requires a stress test at this moment. It is hard to say that this is a finding related to ischemic disease, as the patient is able to walk distances and move around well, without any equivalent of angina. She walks with a walker, but prior to that she was a very active person.   I am going to put in a consultation for Cardiology, [facility], to see if they can see her early in the morning, and get an echocardiogram early in the morning as well. The patient is going to have a Foley catheter. She is going to have pain control, nausea control. The patient looks very dry. A small amount of IV fluids is going to be given. Chest x-ray shows some changes. They are consistent with chronic findings. No signs of fluid overload. No signs of CHF. We are going to give her a small amount of fluids. She is to be cautious.   Since the patient has atrial fibrillation, new onset, we are going to give a call to Cardiology to see if they can see her early in the morning for clearance, and we are going to do an echocardiogram.   The patient has orders for traction device and a Foley catheter.   2.  Atrial fibrillation, new onset. The patient is on a beta blocker right now, which we are going to continue through the operative setting to prevent any cardiac complications. Monitor under telemetry. Echocardiogram ordered for the morning. No anticoagulation indicated at this moment, as the patient is going to require surgery. The patient has hypertension, diabetes, and she is above 85, with a CHADS score of 3, for what we will consider doing chronic anticoagulation, unless she can be out of the rhythm.   3.  Diabetes is stable. Continue insulin sliding scale. Continue diabetic diet. Continue metformin.   4.  Hypertension. Seems to be stable. Continue treatment with  atenolol.   5.  Anemia is also stable. Continue iron.   Other medical problems are very stable.   6.  Gastrointestinal prophylaxis with proton pump inhibitor. Deep vein thrombosis prophylaxis with compression devices. Lovenox after surgery.   I spent about 50 minutes with this patient. Transfer care to Dr. Marcello Fennel.   ____________________________ Felipa Furnace, MD rsg:mr D: 05/15/2013 17:13:46 ET T: 05/15/2013 18:26:47 ET JOB#: [id number]  cc: Felipa Furnace, MD, <Dictator> Karlisha Mathena Juanda Chance MD ELECTRONICALLY SIGNED 05/16/2013 1:00

## 2014-09-08 NOTE — Consult Note (Signed)
PATIENT NAME:  Diana Braun, Diana Braun MR#:  [id number] DATE OF BIRTH:  August 02, 1922  DATE OF CONSULTATION:  05/16/2013  REFERRING PHYSICIAN:   CONSULTING PHYSICIAN:  Marcina MillardAlexander Hadyn Blanck, MD  PRIMARY CARE PHYSICIAN: Dr. Marcello FennelHande.   CHIEF COMPLAINT: Right hip pain.   REASON FOR CONSULTATION: Consultation requested for evaluation for preoperative cardiovascular evaluation and atrial fibrillation.   HISTORY OF PRESENT ILLNESS: The patient is an 81 year old Filipino lady, who speaks very little [place], daughter present to interpret. The patient has a history of diabetes without prior cardiac history. She has a 3-day history of right hip pain with radiation down to her right knee with some mild lower extremity swelling. X-ray was performed, which revealed an acute right femoral neck fracture. The patient was noted to be in atrial fibrillation with a controlled rate. She denied chest pain or shortness of breath. Echocardiogram was performed earlier today, which revealed normal left ventricular function with moderate tricuspid regurgitation without other significant valvular abnormalities.   PAST MEDICAL HISTORY:  1.  Diabetes.  2.  Hypothyroidism.  3.  Chronic anemia.  4.  History of gout.   MEDICATIONS: Aspirin 1 daily, lisinopril 5 mg daily, metformin 500 mg b.i.d., allopurinol 100 mg daily, atenolol 25 mg daily, ferrous sulfate 325 mg daily, Protonix 40 mg daily and levothyroxine 50 mcg daily.   SOCIAL HISTORY: The patient is a resident of the [place]. Her family is in [place]. She denies tobacco abuse.   FAMILY HISTORY: No immediate family history for coronary artery disease or myocardial infarction.   REVIEW OF SYSTEMS: CONSTITUTIONAL: No fever or chills. EYES: No blurry vision.  EARS: No hearing loss. RESPIRATORY: No shortness of breath. CARDIOVASCULAR: No chest pain. GASTROINTESTINAL: No nausea, vomiting, or diarrhea. GENITOURINARY: No dysuria or hematuria. ENDOCRINE: No polyuria or polydipsia.  MUSCULOSKELETAL: Right hip pain as described above. NEUROLOGICAL: No focal muscle weakness or numbness.  PSYCHOLOGICAL: No depression or anxiety.   PHYSICAL EXAMINATION:  VITAL SIGNS: Blood pressure 115/64, pulse 92, respirations 18, temperature 98.4 and pulse oximetry 94%.  HEENT: Pupils equal and reactive to light and accommodation.  NECK: Supple without thyromegaly.  LUNGS: Clear.  HEART: Normal JVP. Normal PMI. Irregular/ irregular rhythm. Normal S1, S2. No appreciable gallop, murmur, or rub.  ABDOMEN: Soft and nontender. Pulses were intact bilaterally.  MUSCULOSKELETAL: Normal muscle tone.  NEUROLOGIC: The patient is alert and oriented x 3. Motor and sensory both grossly intact.   IMPRESSION: An 81 year old female with history of diabetes. Otherwise, no prior history of myocardial infarction, congestive heart failure, chronic kidney disease or prior stroke. The patient would be an acceptable risk for surgery for right hip fracture. Echocardiogram earlier today revealed normal left ventricular function with moderate tricuspid regurgitation without significant other valvular abnormalities.   RECOMMENDATIONS:  1.  I agree with current therapy.  2.  Proceed with hip surgery as planned.  3.  Would defer further cardiac diagnostics at this time. ____________________________ Marcina MillardAlexander Diana Salazar, MD ap:aw D: 05/16/2013 08:33:03 ET T: 05/16/2013 08:58:47 ET JOB#: [id number]  cc: Marcina MillardAlexander Lincoln Ginley, MD, <Dictator> Marcina MillardALEXANDER Shanie Mauzy MD ELECTRONICALLY SIGNED 05/18/2013 8:41

## 2014-09-09 NOTE — Consult Note (Signed)
Brief Consult Note: Diagnosis: osteoarthritis right knee.   Patient was seen by consultant.   Recommend further assessment or treatment.   Comments: 81 year old female complains of right knee pain. Had right hip hemiarthroplasty in December by Dr Ernest PineHooten and was participating in rehabilitation facility until readmission for medical problems.  Says she had cortisone injection in knee in December.  Dr Marcello FennelHande is her primary MD.   Exam:  alert and comfortable.  Right knee with large medial joint line spurs and varus.  no effusion or reddness.  range of motion 10-95*   stable to stress.  neurovascular status good distally.   X-rays:  advanced osteoarthritis right knee with medial joint line narrowing and diffuse spurring.  Rx:  Brace        NSAIDs when off Lovenox        Too soon to reinject knee (3-4 months usual)        Follow up Dr Ernest PineHooten after discharge.  Electronic Signatures: Valinda HoarMiller, Aften Lipsey E (MD)  (Signed 11-Jan-15 14:28)  Authored: Brief Consult Note   Last Updated: 11-Jan-15 14:28 by Valinda HoarMiller, Graceanne Guin E (MD)

## 2014-09-09 NOTE — H&P (Signed)
PATIENT NAME:  Diana Braun, Diana Braun    CHIEF COMPLAINT: Altered mental status.   HISTORY OF PRESENT ILLNESS: Diana Braun is an 81 year old pleasant white female who has a history of diabetes mellitus, hypertension, anemia,  hypokalemia and had a recent right hip fracture, underwent hemiarthroplasty. The patient was discharged to skilled nursing facility on May 20, 2013. The patient was participating well in the therapy. Since Monday night the patient started to have multiple episodes of vomiting and diarrhea. This afternoon, patient was noted to be extremely lethargic, confused and concerning this, the patient is brought to the Emergency Department. Work-up in the Emergency Department, the patient has a urinary tract infection, mild elevation of the lipase of 772. The patient underwent a right upper quadrant ultrasound, which showed cholelithiasis, gallstones, showed gallbladder wall thickening with possible early cholecystitis.  The patient does not have any white blood cell count or elevated LFTs. Unable to obtain any history from the patient as the patient is extremely lethargic. The history is mainly obtained from the patient's daughter.   PAST MEDICAL HISTORY: 1.  A two day history of atrial fibrillation, not on any anticoagulation.  2.  Diabetes mellitus, on oral medication.  3.  Hypertension.  4.  Anemia  5.  Hypothyroidism.  6.  Chronic lower extremity swelling.   PAST SURGICAL HISTORY:  1.  Cataract removal.  2.  Right hip arthroplasty.   ALLERGIES: No known drug allergies.   HOME MEDICATIONS: 1.  Tramadol 50 mg 1 tablet every four hours as needed.  2.  Rocephin 1 gram IM daily for aspiration pneumonia.  3.  Reglan 5 mg 3 times a day.  4.  Phenergan 25 mg as needed.  5.  Protonix 40 mg 2 times a day.  6.  Oxycodone 5 mg  every four hours as needed.  7.  Metformin 1000 mg 2 times a day.  8.  Lovenox 40 mg daily.  9.  Levothyroxine 50 mcg once a day.  10.  Acetaminophen 650 mg every four hours as needed.   SOCIAL HISTORY: No history of smoking, drinking alcohol or using illicit drugs. The patient lives part of the time in the [place] and the time in the [place] with her daughter.   FAMILY HISTORY: Father died in an accident at young age. Mother and two brothers with diabetes mellitus.   REVIEW OF SYSTEMS: Could not be obtained from the patient, as the patient is in altered mental.   PHYSICAL EXAMINATION: GENERAL: Frail looking female, looks extremely lethargic, confused.  VITAL SIGNS: Temperature 98.2, pulse 88, blood pressure in 96/55, respiratory rate of 18, oxygen saturation 98% on 2 liters of oxygen.  HEENT: Head normocephalic, atraumatic. There is no sclerae icterus. Conjunctivae normal. Pupils equal and react to light. Extraocular movements are intact. Mucous membranes are dry. No pharyngeal erythema.  NECK: Supple. No lymphadenopathy. No JVD. No carotid bruit. No thyromegaly.  CHEST: Has no focal tenderness.  LUNGS:  Bilaterally clear to auscultation. Decreased breath sounds in the bilateral lower lobes.  HEART: S1, S2, regular, tachycardia.  ABDOMEN: Bowel sounds present. Soft, mildly discomfort, no Murphy's sign.  EXTREMITIES: No pedal edema. Pulses 2+.  NEUROLOGIC:  The patient is extremely lethargic. Is able to answer some of the questions. Could not tell the place and time. No apparent cranial nerve abnormalities.  Could not examine the motor and sensory.  SKIN: No rash or lesions.  MUSCULOSKELETAL: Could not examine.   LABORATORY DATA: Urinalysis:  2+ leukocyte esterase, WBC of 29, 1+ bacteria. Ultrasound of the abdomen: Cholelithiasis. Gallbladder wall is borderline thickened early cholecystitis must be of concern.   CBC: WBC of 11 hemoglobin 8.7, platelet count 459. Complete  metabolic panel: Alkaline phosphatase of 132, total bilirubin of 8.7, AST 75.   ASSESSMENT AND PLAN: Diana Braun comes to the Emergency Department with nausea, vomiting and altered mental status.  1.  Acute pancreatitis. Most likely secondary to cholelithiasis, gallstone induced.  The patient currently does not have any abdominal pain. However, continue with IV fluids and follow up.  2.  Questionable cholecystitis with elevated liver enzymes. The patient on does not have any elevated white blood cell count. We will keep the patient on meropenem  as the patient has been on Rocephin for aspiration pneumonia.  3.  Urinary tract infection.  Follow up with urine cultures. We will keep the meropenem, concerned about possible ESBL organisms.  4.  Altered mental status.  Most likely a combination of factors; infection, medication. We will hold all sedative medications. Continue with IV fluids. Treat the underlying infection.  5.  Recent right hip surgery. We will continue the physical therapy after the patient's mental status improves.  6.  Debility. We will involve the physical therapy, occupational therapy.  7.  Keep the patient on deep vein thrombosis prophylaxis with Lovenox.   TIME SPENT: 55 minutes.    ____________________________ Susa GriffinsPadmaja Atira Borello, MD pv:cc D: 05/25/2013 23:39:59 ET T: 05/26/2013 00:03:23 ET JOB#: [id number]  cc: Susa GriffinsPadmaja Renlee Floor, MD, <Dictator> Barbette ReichmannVishwanath Hande, MD Susa GriffinsPADMAJA Keirah Konitzer MD ELECTRONICALLY SIGNED 05/27/2013 21:22

## 2014-09-09 NOTE — Discharge Summary (Signed)
PATIENT NAME:  Diana KungVISAYA, Diana Braun MR#:  [id number] DATE OF BIRTH:  March 10, 1923  DATE OF ADMISSION:  05/15/2013 DATE OF DISCHARGE:    DIAGNOSES AT TIME OF DISCHARGE:  1.  Right hip fracture status post right hip hemiarthroplasty.  2.  A history of atrial fibrillation.  3.  Type 2 diabetes.  4.  Hypertension.  5.  Anemia.   CHIEF COMPLAINT:  Right hip pain.   HISTORY OF PRESENT ILLNESS:  Diana Braun is an 81 year old female with a history of type 2 diabetes, hypothyroidism, anemia, gout, who [place], presents to the ER brought by family complaining of right hip pain. The patient states that she hurt her back a few months back when she was pushing some boxes and then subsequently then subsequently hit her right hip on that side and did not think much of it but lately her pain has gradually gotten worse in the point that she is having difficulty ambulating. X-ray of the right hip showed an acute right angle femoral neck fracture and an EKG showed evidence of atrial fibrillation that was rate controlled with right bundle branch block and anterior fascicular block.   PHYSICAL EXAMINATION:  GENERAL:  She was in significant pain, blood pressure 114/64, pulse was 82, respirations 18, temperature 99, O2 sat 99% on room air.  HEENT:  NCAT.  NECK:  Supple. No JVD.  HEART:  S1, S2, irregular rhythm.  LUNGS:  Clear.  ABDOMEN:  Soft, nontender.  EXTREMITIES:  Trace edema.  NEUROLOGIC:  Alert and oriented x 3. No obvious focal signs.   The patient had external rotation of the right lower extremity with significant tenderness at the level of the right hip. The patient was seen in consultation by Dr. Ernest PineHooten and was also seen in consultation by cardiologist, Dr. Darrold JunkerParaschos. She underwent right hip hemiarthroplasty on 05/16/2013 and postoperatively had an episode of sinus pauses. She was subsequently moved to the CCU and with fluids, her symptoms resolved. She had episodic vomiting as well and this also  improved. Her pain was kept under control. She was seen by Physical Therapy and she was discharged in stable condition on the following medications.   DISCHARGE MEDICATIONS:  1.  Oxycodone 5 mg every 4 hours as needed. 2.  Enoxaparin 40 mg subcutaneous every 24 hours 14 days.  3.  Levothyroxine 50 mcg once a day.  4.  Pantoprazole 40 mg b.i.d. 5.  Docusate sodium 100 mg b.i.d. 6.  Metformin 5 mg p.o. b.i.d. with meals.   DISCHARGE INSTRUCTIONS:  The patient was advised to go to rehab and will follow up with Dr. Ernest PineHooten in 1 to 2 weeks' time and also follow up with me, Dr. Marcello FennelHande, in 2 weeks.   TOTAL TIME SPENT IN DISCHARGING THIS PATIENT:  35 minutes.  ____________________________ Diana ReichmannVishwanath Seila Liston, MD vh:jm D: 05/20/2013 13:20:18 ET T: 05/20/2013 13:43:08 ET JOB#: [id number]  cc: Diana ReichmannVishwanath Chantal Worthey, MD, <Dictator> Diana ReichmannVISHWANATH Braleigh Massoud MD ELECTRONICALLY SIGNED 05/20/2013 18:02

## 2014-09-09 NOTE — Consult Note (Signed)
PATIENT NAME:  Diana Braun, Diana Braun MR#:  [id number] DATE OF BIRTH:  26-Jul-1922  DATE OF CONSULTATION:  05/26/2013  CONSULTING PHYSICIAN:  Adella Hare, MD  REPORT OF CONSULTATION:  This this 81 year old female was admitted emergently to the hospital due to protracted nausea and vomiting, and she presents for General Surgery consultation due to a chief complaint of vomiting. She reports that 3 days ago, she began to have some vomiting. She reports she had been having some coughing, and coughing up some productive sputum, and her throat was irritating her. She thinks that led to vomiting. Did vomit several times between 3 days ago and 1 day ago. Also has had some diarrhea. She reports no associated chills or fever. She reports no abdominal or back pain. She came into the Emergency Room, where she was initially evaluated by Emergency Room staff, and did have an abnormally elevated lipase. Also had ultrasound demonstrating gallstones and slight thickening of the gallbladder wall. The patient was admitted. Was placed on intravenous meropenem, and has been kept n.p.o. She reports that she is thirsty, asking for water. She is not having any subsequent nausea or vomiting since admission. She reports no abdominal pain, and asking if she can go home.   PAST MEDICAL HISTORY DOES INCLUDE: 1.  Non-insulin-dependent diabetes mellitus.  2.  Hypertension.  3.  Hypothyroidism.  4.  Anemia. 5.  Recent right hip fracture, which had a hemiarthroplasty. Had moved to a rehab facility, where she was having physical therapy, beginning to walk some and, according to the admission note, she had had 2 days of atrial fibrillation.   PREVIOUS SURGERIES:  1.  She has had bilateral cataract surgery, and reports having implants on each side. 2.  Right hip arthroplasty.   MEDICATIONS INCLUDE:  1.  Acetaminophen 2 tablets every 4 hours as needed for pain. 2.  Levothyroxine 50 mcg daily. 3.  Lovenox 40 mg injection daily.  4.   Metformin 1000 mg 2 times per day. 5.  Oxycodone 5 mg q. 4 hours p.r.n. for pain. 6.  Pantoprazole 40 mg 2 times per day. 7.  Promethazine 25 mg injection p.r.n. for nausea and vomiting. 8.  Reglan 5 mg 3 times a day. 9.  Recent Rocephin 1 gram injected daily. 10.  Tramadol 50 mg every 4 hours as needed for pain.   DRUG ALLERGIES: None known.   SOCIAL HISTORY: She is originally from [place] and spent some of her time in [place] some of her time in the [place].   She does not smoke. Does not drink any alcohol.   FAMILY HISTORY: Positive for diabetes.   REVIEW OF SYSTEMS: She reports a limited degree of ambulation. Recently has been undergoing physical therapy, and beginning to walk some with a walker. Reports improvement with respect to her incision of the right hip. She reports minimal degree of pain at that site. She does report a recent productive cough. Has had some mild degree of hypoxia. She reports minimal degree of shortness of breath. Has been placed on oxygen, and reports breathing comfortably at present. She reports no swollen glands. No difficulty swallowing. No chest pains. No abdominal pain. No back pain. Has had some loose bowel movements. She reports she is voiding satisfactorily. Has had some minimal degree of swelling of her right leg and, according to records, she was having some lethargy and confusion at the time of admission. Review of systems otherwise negative.   PHYSICAL EXAMINATION: GENERAL: She is awake  and alert.  VITAL SIGNS: Temperature is 98.3, pulse 90, respirations 18, blood pressure 127/68, pulse ox 96% on 2 liters of oxygen.  SKIN: Warm and dry, without rash or jaundice.  HEENT: Pupils were uneven, somewhat larger on the left side, related to iridectomy. Pharynx appears somewhat dry. There appears to be some mild degree of white discoloration of her tongue.  NECK: With no palpable mass.  RESPIRATORY: Lung sounds are clear. No current  respiratory distress.  HEART: Regular rhythm, S1 and S2, without murmur.  ABDOMEN: Soft, flat and nontender, with no palpable mass. Dressing from right hip was removed. Dressing has been on since January 3, and wound appears to be dry, with no redness. Minimal degree of slight yellowish discoloration of the skin.  EXTREMITIES: No dependent edema.  NEUROLOGIC: Awake, alert and oriented, moving all extremities.   CLINICAL DATA: Her BUN on admission was 20, and sodium of 127. Liver panel with albumin of 2.3, total bilirubin 0.7, and alkaline phosphatase 132. Her serum magnesium today is 1.4. Her lipase on admission was 772.   Chest x-ray with some evidence of emphysematous change, but no pulmonary infiltrate.   She had ultrasound of the abdomen, which demonstrated multiple gallstones, some 3 mm thickness of the gallbladder wall. No pericholecystic fluid. The pancreas was not reported as being seen.   IMPRESSION: 1.  Recent episodes of nausea, vomiting, diarrhea. 2.  Elevated lipase.  3.  Cholecystitis, cholelithiasis.  4.  Productive cough. 5.  Hyperalbuminemia. 6.  Recent hip fracture.   I discussed this with her in detail. I think it is unclear whether she actually has pancreatitis or possibly her lipase could be elevated due to episodes of vomiting. Discussed that if a patient has biliary pancreatitis. There is risk of recurrence, and there is a role for laparoscopic cholecystectomy.  She would prefer not to have any surgery, if at all possible.   I think it would be prudent to repeat her lipase tomorrow to see if that returns to normal.   I suggest restart her on a clear liquid diet, and I have ordered that. I think she could slowly advance her diet as tolerated. Probably it would be good to have her on a low-fat diet for the first 30 days. If she tolerates her diet satisfactorily, may be able to return home or to the rehab facility.   As noted, I have discussed the potential role of  surgery, but seeing there is an absence of abdominal pain, it is unclear whether she has had pancreatitis or not.   Does not appear to need any antibiotics at present related to her gallbladder or pancreas or abdomen.    ____________________________ Shela CommonsJ. Renda RollsWilton Smith, MD jws:mr D: 05/26/2013 19:32:26 ET T: 05/26/2013 19:52:11 ET JOB#: [id number]  cc: Adella HareJ. Wilton Smith, MD, <Dictator> Adella HareWILTON J SMITH MD ELECTRONICALLY SIGNED 06/01/2013 18:09

## 2014-09-09 NOTE — Op Note (Signed)
PATIENT NAME:  Diana Braun, [name] MR#:  [id number] DATE OF BIRTH:  [birth year]  DATE OF PROCEDURE:  05/16/2013  PREOPERATIVE DIAGNOSIS: Right femoral neck fracture.   POSTOPERATIVE DIAGNOSIS: Right femoral neck fracture.   PROCEDURE PERFORMED: Right hip hemiarthroplasty.   SURGEON: Illene LabradorJames P. Hooten, MD   ASSISTANT: Van ClinesJon Wolfe, PA (required to maintain retraction throughout the procedure).   ANESTHESIA: Spinal.   ESTIMATED BLOOD LOSS: 150 mL.   FLUIDS REPLACED: 1400 mL of crystalloid.   DRAINS: Two medium drains to a Hemovac reservoir.   IMPLANTS UTILIZED: DePuy size 2 Summit femoral stem (cemented), 11 mm Cementralizer, a 43 mm outer diameter Cathcart hip ball, a +0 mm tapered spacer, and a size 4 cement restrictor.   INDICATIONS FOR SURGERY: The patient is an 81 year old female who had approximately a 2-week history of progressive right hip pain. She denied any fall or trauma. Upon presentation to the Emergency Room, radiographs demonstrated a displaced right femoral neck fracture. After discussion of the risks and benefits of surgical intervention, the patient expressed understanding of the risks and benefits and agreed with plans for surgical intervention.   PROCEDURE IN DETAIL: The patient was brought to the operating room, and after adequate spinal anesthesia was achieved, the patient was placed in the left lateral decubitus position. Axillary roll was placed, and all bony prominences were well padded. The patient's right hip and leg were cleaned and prepped with alcohol and DuraPrep and draped in the usual sterile fashion. A "timeout" was performed as per usual protocol. A lateral curvilinear incision was made gently curving towards the posterior superior iliac spine. IT band was incised in line with the skin incisions. Fibers of the gluteus maximus were split in line. The piriformis tendon was identified, skeletonized, and incised at its insertion on the proximal femur and reflected posteriorly.  In a similar fashion, the short external rotators were incised and reflected posteriorly. A T-type posterior capsulotomy was performed. Moderate hemarthrosis was evacuated. A corkscrew device was used to remove the femoral head. The articular surface appeared to be in good condition. The femoral head was subsequently sent to pathology for further evaluation after  checking the diameter with a caliper. It was felt that a 43 mm diameter was appropriate. Next, the acetabulum was inspected and cleared of any bony debris. The articular surface was in good condition. The femoral neck cut was performed using an oscillating saw. A pilot hole for preparation of the proximal femoral canal was created, and a tapered reamer was advanced. Serial broaches were inserted up to a size 2 broach. The calcar region was planed, and trial reduction was performed with a 43 mm hip ball with a +0 neck length. This allowed for good equalization of limb lengths and excellent stability and range of motion. Trial components were removed. The femoral canal was sized, and it was felt that a size 4 cement restrictor was appropriate. A size 4 femoral cement restrictor was inserted to the appropriate depth. Proximal femoral canal was irrigated with copious amounts of normal saline with antibiotic solution using pulsatile lavage and then suctioned dry. The proximal femoral canal was then packed with vaginal packing soaked in dilute Neo-Synephrine. Polymethylmethacrylate cement was prepared in the usual fashion using a vacuum mixer (gentamicin cement was utilized given the patient's history of diabetes). The vaginal packing was removed, and the canal was again irrigated and suctioned dry. The cement was introduced in a retrograde fashion and pressurized. A size 2 Summit femoral component with an 11 mm  distal Cementralizer was positioned and impacted into place. Excess cement was removed using Personal assistant. After adequate curing of cement, the Morse  taper was cleaned and dried. A 43 mm outer diameter Cathcart hip ball with a +0 tapered spacer was placed on the trunnion and impacted into place. The acetabulum was again inspected and was free of any debris. The hip was reduced and placed through a range of motion. Excellent range of motion was appreciated. The wound was irrigated with copious amounts of normal saline with antibiotic solution using pulsatile lavage and then suctioned dry. Good hemostasis was noted. The posterior capsulotomy was repaired using #5 Ethibond. The piriformis tendon was identified and reapproximated to the undersurface of the gluteus medius tendon. Two medium drains were placed in the wound bed and brought out through a separate stab incision to be attached to a Hemovac reservoir. The IT band was repaired using interrupted sutures of #1 Vicryl. The subcutaneous tissue was reapproximated in layers using first #0 Vicryl followed by 2-0 Vicryl. Skin was closed with skin staples. A sterile dressing was applied.   The patient tolerated the procedure well. She was transported to the recovery room in stable condition.    ____________________________ Illene Labrador. Angie Fava., MD jph:jcm D: 05/16/2013 16:12:50 ET T: 05/16/2013 19:45:03 ET JOB#: [id number]  cc: Fayrene Fearing P. Angie Fava., MD, <Dictator> Illene Labrador Angie Fava MD ELECTRONICALLY SIGNED 05/22/2013 15:58

## 2014-09-09 NOTE — Discharge Summary (Signed)
PATIENT NAME:  Diana KungVISAYA, Diana Braun MR#:  [id number] DATE OF BIRTH:  1922-08-28  DATE OF ADMISSION:  05/25/2013  DATE OF DISCHARGE:  05/30/2013  DIAGNOSES AT TIME OF DISCHARGE: 1.  Altered mental status secondary to acute pancreatitis and cholelithiasis.  2.  Recent right hip surgery.  3.  Type 2 diabetes.  4.  Generalized weakness.  5.  Right knee osteoarthritis.  6.  Anemia.   CHIEF COMPLAINT: Altered mental status.   HISTORY OF PRESENT ILLNESS: Diana Braun is an 81 year old female with a history of type 2 diabetes, hypertension, anemia, who recently underwent right hemiarthroplasty for hip fracture. The patient had been discharged to a skilled nursing facility on January 2, and was participating well in physical therapy, but subsequently started to have episodes of vomiting and diarrhea. The patient was also noted to be lethargic and confused, and was brought to the ED. Workup revealed evidence of elevated lipase of 772 and also evidence for urinary tract infection. The patient underwent right upper quadrant ultrasound, which showed evidence of cholelithiasis, gallstones and gallbladder wall thickening, with possible early cholecystitis.   PAST MEDICAL HISTORY: Significant for recent AFib just prior to her right hip arthroplasty, history of type 2 diabetes, on oral medication, hypertension, anemia, hypothyroidism, chronic lower extremity swelling.   PAST SURGICAL HISTORY: She has had previous cataract surgery and recent right hip hemiarthroplasty.   PHYSICAL EXAMINATION:  GENERAL:  She appeared lethargic and confused.  VITAL SIGNS: Temp was 98.2, pulse was 88, blood pressure 96/55, respirations 18, O2 sat 98% on 2 liters nasal cannula.  HEENT:  Sclerae were anicteric.  NECK: Supple. No lymphadenopathy. No JVD.  LUNGS: Clear to auscultation.  HEART: S1, S2, tachycardic.  ABDOMEN: Soft, nontender.  EXTREMITIES: No edema.  NEUROLOGIC: No focal signs, but patient appeared to be lethargic. Was able  to answer some questions, but was not oriented to time and place.   LABS: Urinalysis showed 2+ leukocyte esterase, WBC count 29, 1+ bacteria. Ultrasound of the abdomen showed evidence of cholelithiasis, gallbladder wall was borderline thickened. WBC 11, hemoglobin 8.7, platelet count 459. Alkaline phosphatase 132, total bili 8.7, AST 75.   HOSPITAL COURSE: The patient was admitted to [facility]. Was also seen in consultation by Dr. Renda RollsWilton Smith, surgeon, who was not sure whether this was an episode of acute pancreatitis or just recurrent vomiting. She was treated with IV antibiotics, and her abdominal pain and vomiting gradually subsided. She will start on a clear liquid diet, and this was gradually advanced, as she was able to tolerate liquids and subsequently solid food. The patient did complain of right knee pain, and was seen in consultation by Dr. Deeann SaintHoward Miller, orthopedist, who recommended a brace, and to follow up with her surgeon, Dr. Ernest PineHooten, after discharge.  The patient's lab work showed gradual improvement, and overall she was able to tolerate liquids. Subsequently, diet was advanced to solids. Her serum sodium was low initially, but gradually improved to 131 at time of discharge. Her albumin remained low at 2.3, and liver function tests also improved. Troponin was negative. She was seen by Physical Therapy. The patient, however, was insistent on going home, and home health nursing was arranged for her.   MEDICATIONS:  She was discharged in stable condition on the following medications:  1.  Acetaminophen 325 mg 2 tabs every 4 hours p.r.n.  2.  Oxycodone 5 mg every 4 hours as needed.  3.  Pantoprazole 40 mg b.i.d.  4.  Levothyroxine 50 mcg once a  day. 5.  Metformin 1000 mg p.o. b.i.d.   INSTRUCTIONS:  The patient was advised home health, physical therapy, and nursing, and follow with me, Dr. Marcello Fennel, in 1 to 2 weeks' time. Also advised to keep her follow-up appointment with Dr. Ernest Pine, orthopedist,  as scheduled.   Total time spent on discharge of this patient:  35 minutes.   ____________________________ Barbette Reichmann, MD vh:mr D: 05/30/2013 12:59:00 ET T: 05/30/2013 20:19:38 ET JOB#: [id number]  cc: Barbette Reichmann, MD, <Dictator> Barbette Reichmann MD ELECTRONICALLY SIGNED 06/16/2013 13:14

## 2015-12-24 ENCOUNTER — Observation Stay
Admission: EM | Admit: 2015-12-24 | Discharge: 2015-12-26 | Disposition: A | Discharge status: Home / Self Care | Payer: Medicare Other | Attending: Internal Medicine | Admitting: Internal Medicine

## 2015-12-24 ENCOUNTER — Emergency Department: Payer: Medicare Other

## 2015-12-24 ENCOUNTER — Encounter: Payer: Self-pay | Admitting: Emergency Medicine

## 2015-12-24 ENCOUNTER — Observation Stay
Admit: 2015-12-24 | Discharge: 2015-12-24 | Disposition: A | Discharge status: Home / Self Care | Payer: Medicare Other | Attending: Cardiovascular Disease | Admitting: Cardiovascular Disease

## 2015-12-24 DIAGNOSIS — R739 Hyperglycemia, unspecified: Secondary | ICD-10-CM

## 2015-12-24 DIAGNOSIS — I482 Chronic atrial fibrillation: Secondary | ICD-10-CM | POA: Diagnosis not present

## 2015-12-24 DIAGNOSIS — I4510 Unspecified right bundle-branch block: Secondary | ICD-10-CM | POA: Insufficient documentation

## 2015-12-24 DIAGNOSIS — J849 Interstitial pulmonary disease, unspecified: Secondary | ICD-10-CM | POA: Diagnosis not present

## 2015-12-24 DIAGNOSIS — I272 Other secondary pulmonary hypertension: Secondary | ICD-10-CM | POA: Diagnosis not present

## 2015-12-24 DIAGNOSIS — I083 Combined rheumatic disorders of mitral, aortic and tricuspid valves: Secondary | ICD-10-CM | POA: Diagnosis not present

## 2015-12-24 DIAGNOSIS — I499 Cardiac arrhythmia, unspecified: Secondary | ICD-10-CM | POA: Diagnosis present

## 2015-12-24 DIAGNOSIS — I700 Atherosclerosis of aorta: Secondary | ICD-10-CM | POA: Insufficient documentation

## 2015-12-24 DIAGNOSIS — S0083XA Contusion of other part of head, initial encounter: Secondary | ICD-10-CM | POA: Diagnosis not present

## 2015-12-24 DIAGNOSIS — N390 Urinary tract infection, site not specified: Secondary | ICD-10-CM | POA: Diagnosis present

## 2015-12-24 DIAGNOSIS — E039 Hypothyroidism, unspecified: Secondary | ICD-10-CM | POA: Insufficient documentation

## 2015-12-24 DIAGNOSIS — E1165 Type 2 diabetes mellitus with hyperglycemia: Secondary | ICD-10-CM | POA: Diagnosis not present

## 2015-12-24 DIAGNOSIS — Z79899 Other long term (current) drug therapy: Secondary | ICD-10-CM | POA: Insufficient documentation

## 2015-12-24 DIAGNOSIS — W19XXXA Unspecified fall, initial encounter: Secondary | ICD-10-CM | POA: Diagnosis not present

## 2015-12-24 DIAGNOSIS — B951 Streptococcus, group B, as the cause of diseases classified elsewhere: Secondary | ICD-10-CM | POA: Diagnosis not present

## 2015-12-24 DIAGNOSIS — D649 Anemia, unspecified: Secondary | ICD-10-CM | POA: Diagnosis not present

## 2015-12-24 DIAGNOSIS — S0990XA Unspecified injury of head, initial encounter: Secondary | ICD-10-CM

## 2015-12-24 DIAGNOSIS — D72829 Elevated white blood cell count, unspecified: Secondary | ICD-10-CM

## 2015-12-24 DIAGNOSIS — W1839XA Other fall on same level, initial encounter: Secondary | ICD-10-CM | POA: Insufficient documentation

## 2015-12-24 DIAGNOSIS — R42 Dizziness and giddiness: Secondary | ICD-10-CM | POA: Diagnosis not present

## 2015-12-24 DIAGNOSIS — I4891 Unspecified atrial fibrillation: Secondary | ICD-10-CM

## 2015-12-24 DIAGNOSIS — Z794 Long term (current) use of insulin: Secondary | ICD-10-CM | POA: Diagnosis not present

## 2015-12-24 DIAGNOSIS — N289 Disorder of kidney and ureter, unspecified: Secondary | ICD-10-CM | POA: Diagnosis not present

## 2015-12-24 DIAGNOSIS — I452 Bifascicular block: Secondary | ICD-10-CM | POA: Insufficient documentation

## 2015-12-24 HISTORY — DX: Disorder of thyroid, unspecified: E07.9

## 2015-12-24 LAB — HEMOGLOBIN A1C: Hgb A1c MFr Bld: 6.2 % — ABNORMAL HIGH (ref 4.0–6.0)

## 2015-12-24 LAB — CBC WITH DIFFERENTIAL/PLATELET
Basophils Absolute: 0.1 10*3/uL (ref 0–0.1)
Basophils Relative: 1 %
EOS ABS: 0.1 10*3/uL (ref 0–0.7)
EOS PCT: 1 %
HCT: 33.5 % — ABNORMAL LOW (ref 35.0–47.0)
HEMOGLOBIN: 11.6 g/dL — AB (ref 12.0–16.0)
LYMPHS ABS: 1.2 10*3/uL (ref 1.0–3.6)
LYMPHS PCT: 11 %
MCH: 32.3 pg (ref 26.0–34.0)
MCHC: 34.5 g/dL (ref 32.0–36.0)
MCV: 93.8 fL (ref 80.0–100.0)
MONOS PCT: 4 %
Monocytes Absolute: 0.5 10*3/uL (ref 0.2–0.9)
NEUTROS PCT: 83 %
Neutro Abs: 9.7 10*3/uL — ABNORMAL HIGH (ref 1.4–6.5)
Platelets: 167 10*3/uL (ref 150–440)
RBC: 3.58 MIL/uL — ABNORMAL LOW (ref 3.80–5.20)
RDW: 14.1 % (ref 11.5–14.5)
WBC: 11.6 10*3/uL — ABNORMAL HIGH (ref 3.6–11.0)

## 2015-12-24 LAB — COMPREHENSIVE METABOLIC PANEL
ALT: 23 U/L (ref 14–54)
AST: 33 U/L (ref 15–41)
Albumin: 4 g/dL (ref 3.5–5.0)
Alkaline Phosphatase: 50 U/L (ref 38–126)
Anion gap: 6 (ref 5–15)
BUN: 35 mg/dL — AB (ref 6–20)
CHLORIDE: 104 mmol/L (ref 101–111)
CO2: 25 mmol/L (ref 22–32)
CREATININE: 1.15 mg/dL — AB (ref 0.44–1.00)
Calcium: 9.4 mg/dL (ref 8.9–10.3)
GFR calc Af Amer: 47 mL/min — ABNORMAL LOW (ref >60)
GFR calc non Af Amer: 40 mL/min — ABNORMAL LOW (ref >60)
Glucose, Bld: 159 mg/dL — ABNORMAL HIGH (ref 65–99)
POTASSIUM: 5 mmol/L (ref 3.5–5.1)
SODIUM: 135 mmol/L (ref 135–145)
Total Bilirubin: 0.6 mg/dL (ref 0.3–1.2)
Total Protein: 8.2 g/dL — ABNORMAL HIGH (ref 6.5–8.1)

## 2015-12-24 LAB — URINALYSIS COMPLETE WITH MICROSCOPIC (ARMC ONLY)
Bilirubin Urine: NEGATIVE
Glucose, UA: 50 mg/dL — AB
Ketones, ur: NEGATIVE mg/dL
Nitrite: NEGATIVE
PROTEIN: NEGATIVE mg/dL
Specific Gravity, Urine: 1.012 (ref 1.005–1.030)
pH: 5 (ref 5.0–8.0)

## 2015-12-24 LAB — GLUCOSE, CAPILLARY
GLUCOSE-CAPILLARY: 109 mg/dL — AB (ref 65–99)
GLUCOSE-CAPILLARY: 131 mg/dL — AB (ref 65–99)
Glucose-Capillary: 154 mg/dL — ABNORMAL HIGH (ref 65–99)

## 2015-12-24 LAB — TSH
TSH: 3.409 u[IU]/mL (ref 0.350–4.500)
TSH: 1.952 u[IU]/mL (ref 0.350–4.500)

## 2015-12-24 LAB — TROPONIN I
Troponin I: <0.03 ng/mL (ref <0.03)
Troponin I: <0.03 ng/mL (ref <0.03)
Troponin I: 0.03 ng/mL (ref <0.03)

## 2015-12-24 LAB — PROTIME-INR
INR: 1.11
Prothrombin Time: 14.3 seconds (ref 11.4–15.2)

## 2015-12-24 LAB — MAGNESIUM: MAGNESIUM: 1.5 mg/dL — AB (ref 1.7–2.4)

## 2015-12-24 MED ORDER — METOPROLOL TARTRATE 25 MG PO TABS
25.0000 mg | ORAL_TABLET | Freq: Two times a day (BID) | ORAL | Status: DC
  Administered 2015-12-24 – 2015-12-26 (×5): 25 mg via ORAL
  Filled 2015-12-24 (×5): qty 1

## 2015-12-24 MED ORDER — ACETAMINOPHEN 325 MG PO TABS
650.0000 mg | ORAL_TABLET | Freq: Four times a day (QID) | ORAL | Status: DC | PRN
  Administered 2015-12-24: 650 mg via ORAL
  Filled 2015-12-24: qty 2

## 2015-12-24 MED ORDER — DEXTROSE 5 % IV SOLN
1.0000 g | Freq: Once | INTRAVENOUS | Status: AC
  Administered 2015-12-24: 1 g via INTRAVENOUS
  Filled 2015-12-24: qty 10

## 2015-12-24 MED ORDER — SODIUM CHLORIDE 0.9% FLUSH
3.0000 mL | Freq: Two times a day (BID) | INTRAVENOUS | Status: DC
  Administered 2015-12-25 – 2015-12-26 (×3): 3 mL via INTRAVENOUS

## 2015-12-24 MED ORDER — INSULIN ASPART 100 UNIT/ML ~~LOC~~ SOLN
0.0000 [IU] | Freq: Three times a day (TID) | SUBCUTANEOUS | Status: DC
  Administered 2015-12-25: 2 [IU] via SUBCUTANEOUS
  Administered 2015-12-25: 1 [IU] via SUBCUTANEOUS
  Filled 2015-12-24: qty 1

## 2015-12-24 MED ORDER — LEVOTHYROXINE SODIUM 50 MCG PO TABS
50.0000 ug | ORAL_TABLET | Freq: Every day | ORAL | Status: DC
  Administered 2015-12-25 – 2015-12-26 (×2): 50 ug via ORAL
  Filled 2015-12-24 (×2): qty 1

## 2015-12-24 MED ORDER — ONDANSETRON HCL 4 MG PO TABS
4.0000 mg | ORAL_TABLET | Freq: Four times a day (QID) | ORAL | Status: DC | PRN
Start: 2015-12-24 — End: 2015-12-26

## 2015-12-24 MED ORDER — ACETAMINOPHEN 650 MG RE SUPP: 650.0000 mg | Freq: Four times a day (QID) | RECTAL | Status: DC | PRN

## 2015-12-24 MED ORDER — ENOXAPARIN SODIUM 40 MG/0.4ML ~~LOC~~ SOLN
40.0000 mg | SUBCUTANEOUS | Status: DC
  Administered 2015-12-24: 40 mg via SUBCUTANEOUS
  Filled 2015-12-24: qty 0.4

## 2015-12-24 MED ORDER — ONDANSETRON HCL 4 MG/2ML IJ SOLN: 4.0000 mg | Freq: Four times a day (QID) | INTRAMUSCULAR | Status: DC | PRN

## 2015-12-24 MED ORDER — SODIUM CHLORIDE 0.9 % IV SOLN
INTRAVENOUS | Status: AC
  Administered 2015-12-24 – 2015-12-25 (×3): via INTRAVENOUS

## 2015-12-24 MED ORDER — SODIUM CHLORIDE 0.9 % IV BOLUS (SEPSIS)
500.0000 mL | Freq: Once | INTRAVENOUS | Status: AC
  Administered 2015-12-24: 500 mL via INTRAVENOUS

## 2015-12-24 MED ORDER — ENOXAPARIN SODIUM 30 MG/0.3ML ~~LOC~~ SOLN: 30.0000 mg | SUBCUTANEOUS | Status: DC

## 2015-12-24 MED ORDER — DEXTROSE 5 % IV SOLN
1.0000 g | INTRAVENOUS | Status: DC
  Administered 2015-12-25: 1 g via INTRAVENOUS
  Filled 2015-12-24: qty 10

## 2015-12-24 NOTE — Progress Notes (Signed)
Anticoagulation monitoring(Lovenox):  82 yo female ordered Lovenox 40 mg Q24h  Filed Weights   12/24/15 1156  Weight: 118 lb 9.6 oz (53.8 kg)    Body mass index is 25.66 kg/m.   Lab Results  Component Value Date   CREATININE 1.15 (H) 12/24/2015   CREATININE 0.89 05/29/2013   CREATININE 1.00 05/27/2013   Estimated Creatinine Clearance: 22.5 mL/min (by C-G formula based on SCr of 1.15 mg/dL). Hemoglobin & Hematocrit     Component Value Date/Time   HGB 11.6 (L) 12/24/2015 0801   HGB 8.6 (L) 05/27/2013 0505   HCT 33.5 (L) 12/24/2015 0801   HCT 25.1 (L) 05/27/2013 0505     Per Protocol for Patient with estCrcl < 30 ml/min and BMI < 40, will transition to Lovenox 30 mg Q24h.

## 2015-12-24 NOTE — Progress Notes (Signed)
Diana Braun is a 82 y.o. female  [id number]  Primary Cardiologist: Adrian BlackwaterShaukat Arnesha Schiraldi Reason for Consultation: Atrial fibrillation  HPI: This is a 82 year old [place] female presented to the hospital after falling down and had injury and dizziness. Patient was found to be in atrial fibrillation thus I was asked to evaluate the patient. Patient states patient has history of having atrial fibrillation but because of language barrier is not certain that she definitely has it.. Patient apparently was going for bed commode when she felt dizzy and landed on the floor and has on the right orbital area swelling and ecchymosis. Her past medical history is history of thyroid disease social history unremarkable.   Review of Systems: No chest pain palpitation or fluttering   Past Medical History:  Diagnosis Date  . Thyroid disease     Medications Prior to Admission  Medication Sig Dispense Refill  . levothyroxine (SYNTHROID, LEVOTHROID) 50 MCG tablet Take 50 mcg by mouth daily.       Melene Muller. [START ON 12/25/2015] cefTRIAXone (ROCEPHIN)  IV  1 g Intravenous Q24H  . [START ON 12/25/2015] enoxaparin (LOVENOX) injection  30 mg Subcutaneous Q24H  . insulin aspart  0-9 Units Subcutaneous TID WC  . [START ON 12/25/2015] levothyroxine  50 mcg Oral QAC breakfast  . metoprolol tartrate  25 mg Oral BID  . sodium chloride flush  3 mL Intravenous Q12H    Infusions: . sodium chloride 75 mL/hr at 12/24/15 1307    No Known Allergies  Social History   Social History  . Marital status: Widowed    Spouse name: N/A  . Number of children: N/A  . Years of education: N/A   Occupational History  . Not on file.   Social History Main Topics  . Smoking status: Never Smoker  . Smokeless tobacco: Never Used  . Alcohol use No  . Drug use: No  . Sexual activity: Not on file   Other Topics Concern  . Not on file   Social History Narrative  . No narrative on file    History reviewed. No pertinent family  history.  PHYSICAL EXAM: Vitals:   12/24/15 1122 12/24/15 1156  BP: (!) 146/61 (!) 158/96  Pulse: 73 92  Resp: 16 19  Temp:  99.1 F (37.3 C)     Intake/Output Summary (Last 24 hours) at 12/24/15 1618 Last data filed at 12/24/15 1542  Gross per 24 hour  Intake                0 ml  Output              350 ml  Net             -350 ml    General:  Well appearing. No respiratory difficulty HEENT: normal Neck: supple. no JVD. Carotids 2+ bilat; no bruits. No lymphadenopathy or thryomegaly appreciated. Cor: PMI nondisplaced. Regular rate & rhythm. No rubs, gallops or murmurs. Lungs: clear Abdomen: soft, nontender, nondistended. No hepatosplenomegaly. No bruits or masses. Good bowel sounds. Extremities: no cyanosis, clubbing, rash, edema Neuro: alert & oriented x 3, cranial nerves grossly intact. moves all 4 extremities w/o difficulty. Affect pleasant.  WGN:FAOZHYECG:Atrial fibrillation with controlled ventricular response rate about 80/m with right bundle branch block and left anterior fascicular block.  Results for orders placed or performed during the hospital encounter of 12/24/15 (from the past 24 hour(s))  Comprehensive metabolic panel     Status: Abnormal   Collection Time: 12/24/15  8:01 AM  Result Value Ref Range   Sodium 135 135 - 145 mmol/L   Potassium 5.0 3.5 - 5.1 mmol/L   Chloride 104 101 - 111 mmol/L   CO2 25 22 - 32 mmol/L   Glucose, Bld 159 (H) 65 - 99 mg/dL   BUN 35 (H) 6 - 20 mg/dL   Creatinine, Ser 1.61 (H) 0.44 - 1.00 mg/dL   Calcium 9.4 8.9 - 09.6 mg/dL   Total Protein 8.2 (H) 6.5 - 8.1 g/dL   Albumin 4.0 3.5 - 5.0 g/dL   AST 33 15 - 41 U/L   ALT 23 14 - 54 U/L   Alkaline Phosphatase 50 38 - 126 U/L   Total Bilirubin 0.6 0.3 - 1.2 mg/dL   GFR calc non Af Amer 40 (L) >60 mL/min   GFR calc Af Amer 47 (L) >60 mL/min   Anion gap 6 5 - 15  CBC with Differential     Status: Abnormal   Collection Time: 12/24/15  8:01 AM  Result Value Ref Range   WBC 11.6 (H) 3.6  - 11.0 K/uL   RBC 3.58 (L) 3.80 - 5.20 MIL/uL   Hemoglobin 11.6 (L) 12.0 - 16.0 g/dL   HCT 04.5 (L) 40.9 - 81.1 %   MCV 93.8 80.0 - 100.0 fL   MCH 32.3 26.0 - 34.0 pg   MCHC 34.5 32.0 - 36.0 g/dL   RDW 91.4 78.2 - 95.6 %   Platelets 167 150 - 440 K/uL   Neutrophils Relative % 83 %   Neutro Abs 9.7 (H) 1.4 - 6.5 K/uL   Lymphocytes Relative 11 %   Lymphs Abs 1.2 1.0 - 3.6 K/uL   Monocytes Relative 4 %   Monocytes Absolute 0.5 0.2 - 0.9 K/uL   Eosinophils Relative 1 %   Eosinophils Absolute 0.1 0 - 0.7 K/uL   Basophils Relative 1 %   Basophils Absolute 0.1 0 - 0.1 K/uL  Troponin I     Status: None   Collection Time: 12/24/15  8:01 AM  Result Value Ref Range   Troponin I <0.03 <0.03 ng/mL  Protime-INR     Status: None   Collection Time: 12/24/15  8:01 AM  Result Value Ref Range   Prothrombin Time 14.3 11.4 - 15.2 seconds   INR 1.11   TSH     Status: None   Collection Time: 12/24/15  8:01 AM  Result Value Ref Range   TSH 3.409 0.350 - 4.500 uIU/mL  Magnesium     Status: Abnormal   Collection Time: 12/24/15  8:01 AM  Result Value Ref Range   Magnesium 1.5 (L) 1.7 - 2.4 mg/dL  Glucose, capillary     Status: Abnormal   Collection Time: 12/24/15  8:06 AM  Result Value Ref Range   Glucose-Capillary 154 (H) 65 - 99 mg/dL  Urinalysis complete, with microscopic     Status: Abnormal   Collection Time: 12/24/15  9:05 AM  Result Value Ref Range   Color, Urine YELLOW (A) YELLOW   APPearance HAZY (A) CLEAR   Glucose, UA 50 (A) NEGATIVE mg/dL   Bilirubin Urine NEGATIVE NEGATIVE   Ketones, ur NEGATIVE NEGATIVE mg/dL   Specific Gravity, Urine 1.012 1.005 - 1.030   Hgb urine dipstick 1+ (A) NEGATIVE   pH 5.0 5.0 - 8.0   Protein, ur NEGATIVE NEGATIVE mg/dL   Nitrite NEGATIVE NEGATIVE   Leukocytes, UA 3+ (A) NEGATIVE   RBC / HPF 0-5 0 - 5 RBC/hpf  WBC, UA 6-30 0 - 5 WBC/hpf   Bacteria, UA RARE (A) NONE SEEN   Squamous Epithelial / LPF 0-5 (A) NONE SEEN  Troponin I     Status: None    Collection Time: 12/24/15 11:18 AM  Result Value Ref Range   Troponin I <0.03 <0.03 ng/mL  Troponin I     Status: None   Collection Time: 12/24/15 12:43 PM  Result Value Ref Range   Troponin I <0.03 <0.03 ng/mL  Hemoglobin A1c     Status: Abnormal   Collection Time: 12/24/15 12:43 PM  Result Value Ref Range   Hgb A1c MFr Bld 6.2 (H) 4.0 - 6.0 %  TSH     Status: None   Collection Time: 12/24/15 12:43 PM  Result Value Ref Range   TSH 1.952 0.350 - 4.500 uIU/mL   Dg Chest 2 View  Result Date: 12/24/2015 CLINICAL DATA:  Got up to go to the bathroom at 0300 hours, became dizzy and fell striking RIGHT side of face, no loss of consciousness, RIGHT facial swelling and bruising, RIGHT side neck pain, history thyroid disease EXAM: CHEST  2 VIEW COMPARISON:  05/25/2013 FINDINGS: Borderline enlargement of cardiac silhouette. Calcified tortuous aorta. Mediastinal contours pulmonary vascularity normal. Chronic interstitial prominence with without infiltrate, pleural effusion or pneumothorax. Bones demineralized with healing fracture of the posterolateral LEFT seventh rib. IMPRESSION: Borderline enlargement of cardiac silhouette. Chronic interstitial lung disease. Aortic atherosclerosis pre Electronically Signed   By: Ulyses Southward M.D.   On: 12/24/2015 08:43   Ct Head Wo Contrast  Result Date: 12/24/2015 CLINICAL DATA:  Fall. EXAM: CT HEAD WITHOUT CONTRAST CT MAXILLOFACIAL WITHOUT CONTRAST CT CERVICAL SPINE WITHOUT CONTRAST TECHNIQUE: Multidetector CT imaging of the head, cervical spine, and maxillofacial structures were performed using the standard protocol without intravenous contrast. Multiplanar CT image reconstructions of the cervical spine and maxillofacial structures were also generated. COMPARISON:  None. FINDINGS: CT HEAD FINDINGS Prominence of the sulci and ventricles are identified compatible with brain atrophy. There is diffuse low attenuation within the subcortical and periventricular white  matter compatible with chronic microvascular disease. No abnormal extra-axial fluid collection, intracranial hemorrhage or mass identified. No evidence for acute brain infarct. The mastoid air cells appear clear. The calvarium is intact. CT MAXILLOFACIAL FINDINGS The mastoid air cells are clear. There is mild mucosal thickening involving the maxillaries sinuses. No orbital blowout fracture. The mandible is located. The visualized portions of the mandible are intact. The nasal bone is intact. The nasal septum is midline. There is a 4.5 cm hyperdense mass within the soft tissues overlying the right side of face. This is compatible with a hematoma. CT CERVICAL SPINE FINDINGS Normal alignment of the cervical spine. The vertebral body heights are well preserved. The facet joints are appear all well aligned. Multi level disc space narrowing and ventral spurring noted compatible with degenerative disc disease. No fractures or subluxations identified. IMPRESSION: 1. No acute intracranial abnormalities. Small vessel ischemic disease and brain atrophy. 2. No evidence for facial bone fracture. There is a large hematoma involving the soft tissues of the right-side of face. 3. No evidence for cervical spine fracture. There is multi level cervical degenerative disc disease Electronically Signed   By: Signa Kell M.D.   On: 12/24/2015 09:17   Ct Cervical Spine Wo Contrast  Result Date: 12/24/2015 CLINICAL DATA:  Fall. EXAM: CT HEAD WITHOUT CONTRAST CT MAXILLOFACIAL WITHOUT CONTRAST CT CERVICAL SPINE WITHOUT CONTRAST TECHNIQUE: Multidetector CT imaging of the head, cervical spine, and  maxillofacial structures were performed using the standard protocol without intravenous contrast. Multiplanar CT image reconstructions of the cervical spine and maxillofacial structures were also generated. COMPARISON:  None. FINDINGS: CT HEAD FINDINGS Prominence of the sulci and ventricles are identified compatible with brain atrophy. There is  diffuse low attenuation within the subcortical and periventricular white matter compatible with chronic microvascular disease. No abnormal extra-axial fluid collection, intracranial hemorrhage or mass identified. No evidence for acute brain infarct. The mastoid air cells appear clear. The calvarium is intact. CT MAXILLOFACIAL FINDINGS The mastoid air cells are clear. There is mild mucosal thickening involving the maxillaries sinuses. No orbital blowout fracture. The mandible is located. The visualized portions of the mandible are intact. The nasal bone is intact. The nasal septum is midline. There is a 4.5 cm hyperdense mass within the soft tissues overlying the right side of face. This is compatible with a hematoma. CT CERVICAL SPINE FINDINGS Normal alignment of the cervical spine. The vertebral body heights are well preserved. The facet joints are appear all well aligned. Multi level disc space narrowing and ventral spurring noted compatible with degenerative disc disease. No fractures or subluxations identified. IMPRESSION: 1. No acute intracranial abnormalities. Small vessel ischemic disease and brain atrophy. 2. No evidence for facial bone fracture. There is a large hematoma involving the soft tissues of the right-side of face. 3. No evidence for cervical spine fracture. There is multi level cervical degenerative disc disease Electronically Signed   By: Signa Kell M.D.   On: 12/24/2015 09:17   Ct Maxillofacial Wo Contrast  Result Date: 12/24/2015 CLINICAL DATA:  Fall. EXAM: CT HEAD WITHOUT CONTRAST CT MAXILLOFACIAL WITHOUT CONTRAST CT CERVICAL SPINE WITHOUT CONTRAST TECHNIQUE: Multidetector CT imaging of the head, cervical spine, and maxillofacial structures were performed using the standard protocol without intravenous contrast. Multiplanar CT image reconstructions of the cervical spine and maxillofacial structures were also generated. COMPARISON:  None. FINDINGS: CT HEAD FINDINGS Prominence of the  sulci and ventricles are identified compatible with brain atrophy. There is diffuse low attenuation within the subcortical and periventricular white matter compatible with chronic microvascular disease. No abnormal extra-axial fluid collection, intracranial hemorrhage or mass identified. No evidence for acute brain infarct. The mastoid air cells appear clear. The calvarium is intact. CT MAXILLOFACIAL FINDINGS The mastoid air cells are clear. There is mild mucosal thickening involving the maxillaries sinuses. No orbital blowout fracture. The mandible is located. The visualized portions of the mandible are intact. The nasal bone is intact. The nasal septum is midline. There is a 4.5 cm hyperdense mass within the soft tissues overlying the right side of face. This is compatible with a hematoma. CT CERVICAL SPINE FINDINGS Normal alignment of the cervical spine. The vertebral body heights are well preserved. The facet joints are appear all well aligned. Multi level disc space narrowing and ventral spurring noted compatible with degenerative disc disease. No fractures or subluxations identified. IMPRESSION: 1. No acute intracranial abnormalities. Small vessel ischemic disease and brain atrophy. 2. No evidence for facial bone fracture. There is a large hematoma involving the soft tissues of the right-side of face. 3. No evidence for cervical spine fracture. There is multi level cervical degenerative disc disease Electronically Signed   By: Signa Kell M.D.   On: 12/24/2015 09:17     ASSESSMENT AND PLAN: Atrial fibrillation onset probably recent with controlled ventricular response rate. Probably need anticoagulation but because of ecchymosis and swelling and hematoma of right orbital region not good idea to do anticoagulation  at this time. We will get an echocardiogram to further evaluate her ejection fraction and valvular abnormalities.  Jamas Jaquay A

## 2015-12-24 NOTE — Progress Notes (Signed)
Patient  admitted to unit from ER, 82 y/o female, alert and oriented, S/p fall. Right facial hematoma. Patient refusing to applied ice said because it s to cold, family at bedside, will continue to monitor.

## 2015-12-24 NOTE — ED Notes (Signed)
Pt to ct and xray at this time  

## 2015-12-24 NOTE — H&P (Signed)
[facility] [place] at [facility]   PATIENT NAME: Diana Braun    MR#:  [id number]  DATE OF BIRTH:  1922/12/09  DATE OF ADMISSION:  12/24/2015  PRIMARY CARE PHYSICIAN: Diana Morgan, MD   REQUESTING/REFERRING PHYSICIAN:   CHIEF COMPLAINT:   Chief Complaint  Patient presents with  . Fall  . Head Injury  . Dizziness    HISTORY OF PRESENT ILLNESS: Diana Braun  is a 82 y.o. female with a known history of Diabetes mellitus, hypothyroidism, urinary tract infections, atrial fibrillation, who presents to the hospital with complaints of fall. According to patient as well as her sister, patient woke up in the morning to go to bedside commode, she became dizzy and landed on the floor. Patient does not remember falling. She remembers sitting up on the floor. On arrival to the hospital patient was noted to be in atrial fibrillation at rate of 80, her labs revealed urinary tract infection, hospitalist services were contacted for admission. Radiology studies failed to reveal any fractures.  PAST MEDICAL HISTORY:   Past Medical History:  Diagnosis Date  . Thyroid disease     PAST SURGICAL HISTORY: History reviewed. No pertinent surgical history.  SOCIAL HISTORY:  Social History  Substance Use Topics  . Smoking status: Never Smoker  . Smokeless tobacco: Never Used  . Alcohol use No    FAMILY HISTORY: History reviewed. No pertinent family history.  DRUG ALLERGIES: No Known Allergies  Review of Systems  Constitutional: Positive for chills. Negative for fever and weight loss.  HENT: Negative for congestion.   Eyes: Negative for blurred vision and double vision.  Respiratory: Positive for cough and sputum production. Negative for shortness of breath and wheezing.   Cardiovascular: Positive for chest pain and palpitations. Negative for orthopnea, leg swelling and PND.  Gastrointestinal: Negative for abdominal pain, blood in stool, constipation, diarrhea, nausea and  vomiting.  Genitourinary: Negative for dysuria, frequency, hematuria and urgency.  Musculoskeletal: Negative for falls.  Skin: Positive for rash.  Neurological: Positive for weakness. Negative for dizziness, tremors, focal weakness and headaches.  Endo/Heme/Allergies: Does not bruise/bleed easily.  Psychiatric/Behavioral: Negative for depression. The patient does not have insomnia.     MEDICATIONS AT HOME:  Prior to Admission medications   Medication Sig Start Date End Date Taking? Authorizing Provider  levothyroxine (SYNTHROID, LEVOTHROID) 50 MCG tablet Take 50 mcg by mouth daily.   Yes Historical Provider, MD      PHYSICAL EXAMINATION:   VITAL SIGNS: Blood pressure (!) 128/51, pulse 79, resp. rate 19, SpO2 100 %.  GENERAL:  82 y.o.-year-old patient lying in the bed with no acute distress. Right facial hematoma distorting the patient's mouth, eye is completely shut on the right side due to significant swelling/hematoma EYES: Pupils equal, round, reactive to light and accommodation. No scleral icterus. Extraocular muscles intact.  HEENT: Head atraumatic, normocephalic. Oropharynx and nasopharynx clear.  NECK:  Supple, no jugular venous distention. No thyroid enlargement, no tenderness.  LUNGS: Normal breath sounds bilaterally, no wheezing, rales,rhonchi or crepitation. No use of accessory muscles of respiration.  CARDIOVASCULAR: S1, S2 , irregularly irregular. No murmurs, rubs, or gallops.  ABDOMEN: Soft, nontender, nondistended. Bowel sounds present. No organomegaly or mass.  EXTREMITIES: No pedal edema, cyanosis, or clubbing.  NEUROLOGIC: Cranial nerves II through XII are intact. Muscle strength 5/5 in all extremities. Sensation intact. Gait not checked.  PSYCHIATRIC: The patient is alert and oriented x 3.  SKIN: No obvious rash, lesion, or ulcer.  LABORATORY PANEL:   CBC  Recent Labs Lab 12/24/15 0801  WBC 11.6*  HGB 11.6*  HCT 33.5*  PLT 167  MCV 93.8  MCH 32.3  MCHC  34.5  RDW 14.1  LYMPHSABS 1.2  MONOABS 0.5  EOSABS 0.1  BASOSABS 0.1   ------------------------------------------------------------------------------------------------------------------  Chemistries   Recent Labs Lab 12/24/15 0801  NA 135  K 5.0  CL 104  CO2 25  GLUCOSE 159*  BUN 35*  CREATININE 1.15*  CALCIUM 9.4  MG 1.5*  AST 33  ALT 23  ALKPHOS 50  BILITOT 0.6   ------------------------------------------------------------------------------------------------------------------  Cardiac Enzymes  Recent Labs Lab 12/24/15 0801  TROPONINI <0.03   ------------------------------------------------------------------------------------------------------------------  RADIOLOGY: Dg Chest 2 View  Result Date: 12/24/2015 CLINICAL DATA:  Got up to go to the bathroom at 0300 hours, became dizzy and fell striking RIGHT side of face, no loss of consciousness, RIGHT facial swelling and bruising, RIGHT side neck pain, history thyroid disease EXAM: CHEST  2 VIEW COMPARISON:  05/25/2013 FINDINGS: Borderline enlargement of cardiac silhouette. Calcified tortuous aorta. Mediastinal contours pulmonary vascularity normal. Chronic interstitial prominence with without infiltrate, pleural effusion or pneumothorax. Bones demineralized with healing fracture of the posterolateral LEFT seventh rib. IMPRESSION: Borderline enlargement of cardiac silhouette. Chronic interstitial lung disease. Aortic atherosclerosis pre Electronically Signed   By: Diana SouthwardMark  Braun M.D.   On: 12/24/2015 08:43   Ct Head Wo Contrast  Result Date: 12/24/2015 CLINICAL DATA:  Fall. EXAM: CT HEAD WITHOUT CONTRAST CT MAXILLOFACIAL WITHOUT CONTRAST CT CERVICAL SPINE WITHOUT CONTRAST TECHNIQUE: Multidetector CT imaging of the head, cervical spine, and maxillofacial structures were performed using the standard protocol without intravenous contrast. Multiplanar CT image reconstructions of the cervical spine and maxillofacial structures were  also generated. COMPARISON:  None. FINDINGS: CT HEAD FINDINGS Prominence of the sulci and ventricles are identified compatible with brain atrophy. There is diffuse low attenuation within the subcortical and periventricular white matter compatible with chronic microvascular disease. No abnormal extra-axial fluid collection, intracranial hemorrhage or mass identified. No evidence for acute brain infarct. The mastoid air cells appear clear. The calvarium is intact. CT MAXILLOFACIAL FINDINGS The mastoid air cells are clear. There is mild mucosal thickening involving the maxillaries sinuses. No orbital blowout fracture. The mandible is located. The visualized portions of the mandible are intact. The nasal bone is intact. The nasal septum is midline. There is a 4.5 cm hyperdense mass within the soft tissues overlying the right side of face. This is compatible with a hematoma. CT CERVICAL SPINE FINDINGS Normal alignment of the cervical spine. The vertebral body heights are well preserved. The facet joints are appear all well aligned. Multi level disc space narrowing and ventral spurring noted compatible with degenerative disc disease. No fractures or subluxations identified. IMPRESSION: 1. No acute intracranial abnormalities. Small vessel ischemic disease and brain atrophy. 2. No evidence for facial bone fracture. There is a large hematoma involving the soft tissues of the right-side of face. 3. No evidence for cervical spine fracture. There is multi level cervical degenerative disc disease Electronically Signed   By: Signa Kellaylor  Stroud M.D.   On: 12/24/2015 09:17   Ct Cervical Spine Wo Contrast  Result Date: 12/24/2015 CLINICAL DATA:  Fall. EXAM: CT HEAD WITHOUT CONTRAST CT MAXILLOFACIAL WITHOUT CONTRAST CT CERVICAL SPINE WITHOUT CONTRAST TECHNIQUE: Multidetector CT imaging of the head, cervical spine, and maxillofacial structures were performed using the standard protocol without intravenous contrast. Multiplanar CT image  reconstructions of the cervical spine and maxillofacial structures were also generated.  COMPARISON:  None. FINDINGS: CT HEAD FINDINGS Prominence of the sulci and ventricles are identified compatible with brain atrophy. There is diffuse low attenuation within the subcortical and periventricular white matter compatible with chronic microvascular disease. No abnormal extra-axial fluid collection, intracranial hemorrhage or mass identified. No evidence for acute brain infarct. The mastoid air cells appear clear. The calvarium is intact. CT MAXILLOFACIAL FINDINGS The mastoid air cells are clear. There is mild mucosal thickening involving the maxillaries sinuses. No orbital blowout fracture. The mandible is located. The visualized portions of the mandible are intact. The nasal bone is intact. The nasal septum is midline. There is a 4.5 cm hyperdense mass within the soft tissues overlying the right side of face. This is compatible with a hematoma. CT CERVICAL SPINE FINDINGS Normal alignment of the cervical spine. The vertebral body heights are well preserved. The facet joints are appear all well aligned. Multi level disc space narrowing and ventral spurring noted compatible with degenerative disc disease. No fractures or subluxations identified. IMPRESSION: 1. No acute intracranial abnormalities. Small vessel ischemic disease and brain atrophy. 2. No evidence for facial bone fracture. There is a large hematoma involving the soft tissues of the right-side of face. 3. No evidence for cervical spine fracture. There is multi level cervical degenerative disc disease Electronically Signed   By: Signa Kell M.D.   On: 12/24/2015 09:17   Ct Maxillofacial Wo Contrast  Result Date: 12/24/2015 CLINICAL DATA:  Fall. EXAM: CT HEAD WITHOUT CONTRAST CT MAXILLOFACIAL WITHOUT CONTRAST CT CERVICAL SPINE WITHOUT CONTRAST TECHNIQUE: Multidetector CT imaging of the head, cervical spine, and maxillofacial structures were performed using  the standard protocol without intravenous contrast. Multiplanar CT image reconstructions of the cervical spine and maxillofacial structures were also generated. COMPARISON:  None. FINDINGS: CT HEAD FINDINGS Prominence of the sulci and ventricles are identified compatible with brain atrophy. There is diffuse low attenuation within the subcortical and periventricular white matter compatible with chronic microvascular disease. No abnormal extra-axial fluid collection, intracranial hemorrhage or mass identified. No evidence for acute brain infarct. The mastoid air cells appear clear. The calvarium is intact. CT MAXILLOFACIAL FINDINGS The mastoid air cells are clear. There is mild mucosal thickening involving the maxillaries sinuses. No orbital blowout fracture. The mandible is located. The visualized portions of the mandible are intact. The nasal bone is intact. The nasal septum is midline. There is a 4.5 cm hyperdense mass within the soft tissues overlying the right side of face. This is compatible with a hematoma. CT CERVICAL SPINE FINDINGS Normal alignment of the cervical spine. The vertebral body heights are well preserved. The facet joints are appear all well aligned. Multi level disc space narrowing and ventral spurring noted compatible with degenerative disc disease. No fractures or subluxations identified. IMPRESSION: 1. No acute intracranial abnormalities. Small vessel ischemic disease and brain atrophy. 2. No evidence for facial bone fracture. There is a large hematoma involving the soft tissues of the right-side of face. 3. No evidence for cervical spine fracture. There is multi level cervical degenerative disc disease Electronically Signed   By: Signa Kell M.D.   On: 12/24/2015 09:17    EKG: Orders placed or performed during the hospital encounter of 12/24/15  . EKG 12-Lead  . EKG 12-Lead  . EKG 12-Lead  . EKG 12-Lead  EKG in the emergency room revealed atrial fibrillation at a rate of 80, left  axis deviation, ventricular premature complexes, right bundle-branch block and left anterior fascicular block, suspected left ventricular hypertrophy, no  acute ST-T changes    IMPRESSION AND PLAN:  Active Problems:   Dizziness   Fall   Facial hematoma   Acute renal insufficiency   Hyperglycemia   Hypomagnesemia   Leukocytosis   Anemia   UTI (lower urinary tract infection)   A-fib (HCC) #1. Dizziness, admit patient to medical floor, get orthostatic vital signs checked, initiate IV fluids for now. Follow closely, get physical therapist involved for recommendations #2. Fall due to generalized weakness, dizziness, get physical therapist involved recommendations #3. Facial hematoma, supportive therapy #4 acute renal insufficiency, initiate patient on low rate IV fluids, follow urinary output, check creatinine tomorrow morning #5. Hyperglycemia, patient admits of having diagnosis of diabetes, however, apparently her diabetes resolved, we'll get hemoglobin A1c, initiate patient on sliding scale insulin while in the hospital #6. Hypomagnesemia, supplement orally #7. Leukocytosis, likely due to stress and urinary tract infection, follow with therapy #8. Urinary tract infection, initiate patient on Rocephin, get urinary cultures, follow culture results, adjust antibiotics depending on the need #9. Atrial fibrillation, likely paroxysmal, initiate patient on beta blocker, metoprolol, with hopes of converting to sinus rhythm, follow cardiac enzymes 3, get TSH, get echocardiogram and cardiology consultation to advise regarding anticoagulation, although patient's instability may preclude anticoagulation  All the records are reviewed and case discussed with ED provider. Management plans discussed with the patient, family and they are in agreement.  CODE STATUS: Code Status History    This patient does not have a recorded code status. Please follow your organizational policy for patients in this  situation.       TOTAL TIME TAKING CARE OF THIS PATIENT: 55  minutes.    Katharina Caper M.D on 12/24/2015 at 10:29 AM  Between 7am to 6pm - Pager - [phone] After 6pm go to [email or web address] - password [email or web address] [facility]  [facility] [facility]  Office  [phone]  CC: Primary care physician; Diana Morgan, MD

## 2015-12-24 NOTE — ED Provider Notes (Addendum)
[facility] Emergency Department Provider Note  ____________________________________________   I have reviewed the triage vital signs and the nursing notes.   HISTORY  Chief Complaint Fall; Head Injury; and Dizziness    HPI Diana Braun is a 82 y.o. female who is not on Coumadin or any blood thinners, has a history of thyroid disease but does not take any other medications, he is not on aspirin, presents today after falling. She sat up to go to the bathroom in the middle the night and became very dizzy, does not exactly remember what happened after that apparently. History is somewhat limited. Family is there to assist. She is at her baseline according to family this time. She is here because she was feeling dizzy last night and her face was swollen this morning. She does not have any back pain chest pain shortness of breath nausea or vomiting. She denies abdominal pain. She denies  pain in her hips or focal neurologic deficit.      Past Medical History:  Diagnosis Date  . Thyroid disease     There are no active problems to display for this patient.   History reviewed. No pertinent surgical history.    Allergies Review of patient's allergies indicates no known allergies.  History reviewed. No pertinent family history.  Social History Social History  Substance Use Topics  . Smoking status: Never Smoker  . Smokeless tobacco: Never Used  . Alcohol use No    Review of Systems Constitutional: No fever/chills Eyes: No visual changes. ENT: No sore throat. No stiff neck no neck pain Cardiovascular: Denies chest pain. Respiratory: Denies shortness of breath. Gastrointestinal:   no vomiting.  No diarrhea.  No constipation. Genitourinary: Negative for dysuria. Musculoskeletal: Negative lower extremity swelling Skin: Negative for rash. Neurological: Negative for severe headaches, focal weakness or numbness. 10-point ROS otherwise  negative.  ____________________________________________   PHYSICAL EXAM:  VITAL SIGNS: ED Triage Vitals [12/24/15 0739]  Enc Vitals Group     BP      Pulse      Resp      Temp      Temp src      SpO2      Weight      Height      Head Circumference      Peak Flow      Pain Score 10     Pain Loc      Pain Edu?      Excl. in GC?     Constitutional: Alert and oriented. Well appearing and in no acute distress. Eyes: Left eye is normal in appearance, right eye is swollen shut Head: Significant swelling and bruising noted to the right side of her face Nose: No congestion/rhinnorhea. Mouth/Throat: Mucous membranes are moist.  Oropharynx non-erythematous. Neck: No stridor.   Nontender with no meningismus Cardiovascular: Normal rate, regular rhythm. Grossly normal heart sounds.  Good peripheral circulation. Respiratory: Normal respiratory effort.  No retractions. Lungs CTAB. Abdominal: Soft and nontender. No distention. No guarding no rebound Back:  There is no focal tenderness or step off.  there is no midline tenderness there are no lesions noted. there is no CVA tenderness Musculoskeletal: No lower extremity tenderness, no upper extremity tenderness. No joint effusions, no DVT signs strong distal pulses no edema Neurologic:  Normal speech and language. No gross focal neurologic deficits are appreciated.  Skin:  Skin is warm, dry and intact. No rash noted. Psychiatric: Mood and affect are normal. Speech and  behavior are normal.  ____________________________________________   LABS (all labs ordered are listed, but only abnormal results are displayed)  Labs Reviewed  URINALYSIS COMPLETEWITH MICROSCOPIC ([facility] ONLY)  COMPREHENSIVE METABOLIC PANEL  CBC WITH DIFFERENTIAL/PLATELET  TROPONIN I  PROTIME-INR  CBG MONITORING, ED   ____________________________________________  EKG  I personally interpreted any EKGs ordered by me or triage Somewhat difficult rhythm, likely  Atrial fibrillation rate 80 bpm no acute ST elevation or depression normal axis right bundle-branch block noted LAF noted. ____________________________________________  RADIOLOGY  I reviewed any imaging ordered by me or triage that were performed during my shift and, if possible, patient and/or family made aware of any abnormal findings. ____________________________________________   PROCEDURES  Procedure(s) performed: None  Procedures  Critical Care performed: None  ____________________________________________   INITIAL IMPRESSION / ASSESSMENT AND PLAN / ED COURSE  Pertinent labs & imaging results that were available during my care of the patient were reviewed by me and considered in my medical decision making (see chart for details). Her EKG does show some abnormalities, likely atrial fibrillation as I do not see P waves. Patient fell middle of the night, appears that she has trouble recollecting exactly what happened. At her baseline at this time. Unclear if this was a syncopal event. We will obtain imaging diffuse blood work and urine and reassess. I'll check her sugars patient apparently had a history of diabetes but no longer does and her last sugar checked randomly at her doctor was 116. Patient does have very significant bruising to the right side of her face became difficult to appreciate the eye, we'll CT her maxillofacial area, her head, and her neck as she has a distracting injury from an significant facial swelling although low suspicion for fracture. No evidence of hip fracture or other extremity injury at this moment. Abdomen is benign. Given her likely new atrial fibrillation, she likely will need admission.  Clinical Course   ____________________________________________   FINAL CLINICAL IMPRESSION(S) / ED DIAGNOSES  Final diagnoses:  None      This chart was dictated using voice recognition software.  Despite best efforts to proofread,  errors can occur which  can change meaning.      Jeanmarie Plant, MD 12/24/15 1610    Jeanmarie Plant, MD 12/24/15 [phone]

## 2015-12-24 NOTE — ED Notes (Signed)
Report to Tammy, RN

## 2015-12-24 NOTE — ED Triage Notes (Signed)
Pt to ed with family who reports she fell down last night about 330 am.  Pt with noted severe swelling, and bruising noted to right side of face.  Pt states she felt dizzy and then fell hitting face on a jewlrey box.

## 2015-12-24 NOTE — ED Notes (Signed)
Pt states she got up to go to the bathroom around 3am and remembers getting dizzy and fell, hitting the right side of her face on glass jewelry box. Pt lives with daughter, daughter reports hearing the fall, no loc at that time. Severe swelling/bruising noted to right side face. Pt unable to open right eye. NAD noted at this time. Protocols initiated.

## 2015-12-24 NOTE — Progress Notes (Signed)
Chaplain rounded the unit to provide the family with the Advance Directive-Power Attorney and Living Will. It was provided to the two daughters of the patient. Jefm PettyChaplain Martha Ellerby [phone]

## 2015-12-25 LAB — BASIC METABOLIC PANEL
ANION GAP: 5 (ref 5–15)
BUN: 26 mg/dL — ABNORMAL HIGH (ref 6–20)
CALCIUM: 8.4 mg/dL — AB (ref 8.9–10.3)
CO2: 23 mmol/L (ref 22–32)
CREATININE: 0.95 mg/dL (ref 0.44–1.00)
Chloride: 108 mmol/L (ref 101–111)
GFR, EST AFRICAN AMERICAN: 59 mL/min — AB (ref >60)
GFR, EST NON AFRICAN AMERICAN: 51 mL/min — AB (ref >60)
GLUCOSE: 129 mg/dL — AB (ref 65–99)
Potassium: 4.4 mmol/L (ref 3.5–5.1)
Sodium: 136 mmol/L (ref 135–145)

## 2015-12-25 LAB — GLUCOSE, CAPILLARY
GLUCOSE-CAPILLARY: 169 mg/dL — AB (ref 65–99)
Glucose-Capillary: 128 mg/dL — ABNORMAL HIGH (ref 65–99)
Glucose-Capillary: 146 mg/dL — ABNORMAL HIGH (ref 65–99)

## 2015-12-25 LAB — URINE CULTURE: Culture: 60000 — AB

## 2015-12-25 LAB — CBC
HCT: 30.8 % — ABNORMAL LOW (ref 35.0–47.0)
Hemoglobin: 10.7 g/dL — ABNORMAL LOW (ref 12.0–16.0)
MCH: 32.8 pg (ref 26.0–34.0)
MCHC: 34.7 g/dL (ref 32.0–36.0)
MCV: 94.5 fL (ref 80.0–100.0)
Platelets: 151 10*3/uL (ref 150–440)
RBC: 3.26 MIL/uL — ABNORMAL LOW (ref 3.80–5.20)
RDW: 14 % (ref 11.5–14.5)
WBC: 8.5 10*3/uL (ref 3.6–11.0)

## 2015-12-25 LAB — TROPONIN I

## 2015-12-25 LAB — ECHOCARDIOGRAM COMPLETE
Height: 57 in
WEIGHTICAEL: 1897.6 [oz_av]

## 2015-12-25 MED ORDER — AMOXICILLIN 500 MG PO CAPS
500.0000 mg | ORAL_CAPSULE | Freq: Two times a day (BID) | ORAL | Status: DC
  Administered 2015-12-25 – 2015-12-26 (×3): 500 mg via ORAL
  Filled 2015-12-25 (×4): qty 2
  Filled 2015-12-25: qty 1

## 2015-12-25 MED ORDER — AMOXICILLIN 250 MG PO CHEW
500.0000 mg | CHEWABLE_TABLET | Freq: Two times a day (BID) | ORAL | Status: DC
  Filled 2015-12-25 (×2): qty 2

## 2015-12-25 NOTE — Progress Notes (Signed)
Speech Therapy Note: received order, reviewed chart notes. Pt is currently sleeping heavily after taking meds w/ NSG per family member. NSG and family stated pt was able to eat a few bites of "soft" foods - these were purees and sips of thin liquids (water) via straw w/ no overt coughing or change in breathing. NSG gave meds w/ sips of water as well w/ no overt s/s of aspiration noted.  Due to pt's severe facial bruising (around the mouth on R side) and her lack of dentition/dentures, ST services recommends downgrading diet to a Puree consistency w/ thin liquids at this time; strict aspiration precautions. Meds in Puree if necessary for easier swallowing. ST services will f/u tomorrow w/ assessment. NSG updated, agreed.

## 2015-12-25 NOTE — Progress Notes (Signed)
SUBJECTIVE: Patient still has significant swelling on her right orbital region.   Vitals:   12/24/15 1156 12/24/15 1353 12/24/15 1927 12/25/15 0504  BP: (!) 158/96  (!) 151/95 134/62  Pulse: 92  61 64  Resp: 19  18 18   Temp: 99.1 F (37.3 C)  99.4 F (37.4 C) 98.6 F (37 C)  TempSrc: Oral  Oral Oral  SpO2: 97%  94% 97%  Weight: 118 lb 9.6 oz (53.8 kg)     Height:  4\' 9"  (1.448 m)      Intake/Output Summary (Last 24 hours) at 12/25/15 0917 Last data filed at 12/25/15 0505  Gross per 24 hour  Intake           1197.5 ml  Output             1125 ml  Net             72.5 ml    LABS: Basic Metabolic Panel:  Recent Labs  13/12/6506/07/17 0801 12/25/15 0043  NA 135 136  K 5.0 4.4  CL 104 108  CO2 25 23  GLUCOSE 159* 129*  BUN 35* 26*  CREATININE 1.15* 0.95  CALCIUM 9.4 8.4*  MG 1.5*  --    Liver Function Tests:  Recent Labs  12/24/15 0801  AST 33  ALT 23  ALKPHOS 50  BILITOT 0.6  PROT 8.2*  ALBUMIN 4.0   No results for input(s): LIPASE, AMYLASE in the last 72 hours. CBC:  Recent Labs  12/24/15 0801 12/25/15 0043  WBC 11.6* 8.5  NEUTROABS 9.7*  --   HGB 11.6* 10.7*  HCT 33.5* 30.8*  MCV 93.8 94.5  PLT 167 151   Cardiac Enzymes:  Recent Labs  12/24/15 1243 12/24/15 1902 12/25/15 0043  TROPONINI <0.03 0.03* <0.03   BNP: Invalid input(s): POCBNP D-Dimer: No results for input(s): DDIMER in the last 72 hours. Hemoglobin A1C:  Recent Labs  12/24/15 1243  HGBA1C 6.2*   Fasting Lipid Panel: No results for input(s): CHOL, HDL, LDLCALC, TRIG, CHOLHDL, LDLDIRECT in the last 72 hours. Thyroid Function Tests:  Recent Labs  12/24/15 1243  TSH 1.952   Anemia Panel: No results for input(s): VITAMINB12, FOLATE, FERRITIN, TIBC, IRON, RETICCTPCT in the last 72 hours.   PHYSICAL EXAM General: Well developed, well nourished, in no acute distress HEENT:  Normocephalic and atramatic Neck:  No JVD.  Lungs: Clear bilaterally to auscultation and  percussion. Heart: HRRR . Normal S1 and S2 without gallops or murmurs.  Abdomen: Bowel sounds are positive, abdomen soft and non-tender  Msk:  Back normal, normal gait. Normal strength and tone for age. Extremities: No clubbing, cyanosis or edema.   Neuro: Alert and oriented X 3. Psych:  Good affect, responds appropriately  TELEMETRY: Atrial fibrillation with controlled rates  ASSESSMENT AND PLAN: Chronically present atrial fibrillation. Will hold off anticoagulation due to hematoma of the right orbital region. Will restart different type of anticoagulation and then Coumadin probably Xarelto after hematoma resolves.  Active Problems:   Dizziness   Fall   Facial hematoma   Acute renal insufficiency   Hyperglycemia   Hypomagnesemia   Leukocytosis   Anemia   UTI (lower urinary tract infection)   A-fib (HCC)    Puneet Masoner A, MD, [facility] 12/25/2015 9:17 AM

## 2015-12-25 NOTE — Progress Notes (Signed)
Patient is alert and oriented, no complaints of pain.  Did not want to eat her lunch today.  Diet order changed to dysphagia.  Family at bedside.  NSR on monitor.  Continue to assess.

## 2015-12-25 NOTE — Care Management (Addendum)
Met with daughter, Shameria Trimarco [phone]). She states patient lives in a trailer with patients daughter.  One daughter stays with her during the day and another at night. She uses a walker, bedside commode and wheelchair. Patient fell during the night attempting to use the bedside commode.  No home O2, no home oxygen. PCP is at [facility]. She also gets her medications there too. Daughter assists with adls and transportation. May benefit from PT at home. Daughter agreeable if so. She denies the need for HHA. Needs PT consult.

## 2015-12-25 NOTE — Care Management Obs Status (Signed)
MEDICARE OBSERVATION STATUS NOTIFICATION   Patient Details  Name: Diana Braun MRN: [id number] Date of Birth: Dec 27, 1922   Medicare Observation Status Notification Given:  Yes    Marily MemosLisa M Pratt Bress, RN 12/25/2015, 9:45 AM

## 2015-12-25 NOTE — Evaluation (Signed)
Physical Therapy Evaluation Patient Details Name: Diana Braun MRN: [id number] DOB: 01-Sep-1922 Today's Date: 12/25/2015   History of Present Illness  presented to ER status post fall in home environment; admitted wiht UTI, new-onset a-fib (with plans for anticoagulation after resolution of hematoma).  Clinical Impression  Upon evaluation, patient alert and oriented; follows all commands.  [facility], requiring frequent repetition of commands.  Bilat UE/LE strength and ROM grossly symmetrical and WFL; no focal weakness or sensory deficit appreciated.  Able to complete bed mobility with mod indep; sit/stand, basic transfers and gait (40') with RW, min assist.  Short, slightly choppy steps with decreased higher-level balance reactions, but no overt buckling or LOB noted.  Do recommend continued use of RW and +1 for optimal safety with mobility at this time. Would benefit from skilled PT to address above deficits and promote optimal return to PLOF; Recommend transition to HHPT upon discharge from acute hospitalization.     Follow Up Recommendations Home health PT    Equipment Recommendations       Recommendations for Other Services       Precautions / Restrictions Precautions Precautions: Fall Restrictions Weight Bearing Restrictions: No      Mobility  Bed Mobility Overal bed mobility: Modified Independent                Transfers Overall transfer level: Needs assistance Equipment used: Rolling walker (2 wheeled) Transfers: Sit to/from Stand Sit to Stand: Min assist            Ambulation/Gait Ambulation/Gait assistance: Min assist Ambulation Distance (Feet): 40 Feet Assistive device: Rolling walker (2 wheeled)       General Gait Details: reciprocal stepping pattern with decreased step height/length; decreased cadence and gait speed; fair/good walker management with broad turning radius.  Decreased ability to respond to external perturbations, but no overt buckling or  LOB.  No reports of dizziness/lightheadedness  Stairs            Wheelchair Mobility    Modified Rankin (Stroke Patients Only)       Balance Overall balance assessment: Needs assistance Sitting-balance support: No upper extremity supported;Feet supported Sitting balance-Leahy Scale: Good     Standing balance support: Bilateral upper extremity supported Standing balance-Leahy Scale: Fair                               Pertinent Vitals/Pain Pain Assessment: No/denies pain    Home Living Family/patient expects to be discharged to:: Private residence Living Arrangements: Children Available Help at Discharge: Family;Available 24 hours/day Type of Home: House Home Access: Stairs to enter Entrance Stairs-Rails: None Entrance Stairs-Number of Steps: 4 Home Layout: One level Home Equipment: Walker - 2 wheels;Bedside commode      Prior Function Level of Independence: Needs assistance   Gait / Transfers Assistance Needed: Sup/assist for household mobilization with RW  ADL's / Homemaking Assistance Needed: Assist from daughter for ADLs and household chores as needed        Hand Dominance        Extremity/Trunk Assessment   Upper Extremity Assessment: Overall WFL for tasks assessed           Lower Extremity Assessment: Overall WFL for tasks assessed (grossly at least 4/5 throughout)         Communication   Communication: HOH  Cognition Arousal/Alertness: Awake/alert Behavior During Therapy: WFL for tasks assessed/performed Overall Cognitive Status: Within Functional Limits for tasks assessed  General Comments      Exercises Other Exercises Other Exercises: Toilet transfer, SPT with and without RW, min assist--optimal safety/indep with use of RW.  Sit/stand from [facility] with RW, min assist; cuing for hand placement to prevent pulling on RW.      Assessment/Plan    PT Assessment Patient needs continued PT  services  PT Diagnosis Difficulty walking;Generalized weakness   PT Problem List Decreased strength;Decreased activity tolerance;Decreased balance;Decreased mobility;Decreased skin integrity  PT Treatment Interventions DME instruction;Gait training;Stair training;Functional mobility training;Therapeutic activities;Therapeutic exercise;Balance training;Patient/family education   PT Goals (Current goals can be found in the Care Plan section) Acute Rehab PT Goals Patient Stated Goal: to do exercise and get stronger PT Goal Formulation: With patient Time For Goal Achievement: 01/08/16 Potential to Achieve Goals: Good    Frequency Min 2X/week   Barriers to discharge Decreased caregiver support      Co-evaluation               End of Session Equipment Utilized During Treatment: Gait belt Activity Tolerance: Patient tolerated treatment well Patient left: in chair;with call bell/phone within reach;with chair alarm set Nurse Communication: Mobility status    Functional Assessment Tool Used: clinical judgement Functional Limitation: Mobility: Walking and moving around Mobility: Walking and Moving Around Current Status [phone]): At least 20 percent but less than 40 percent impaired, limited or restricted Mobility: Walking and Moving Around Goal Status [phone]): At least 1 percent but less than 20 percent impaired, limited or restricted    Time: 1521-1541 PT Time Calculation (min) (ACUTE ONLY): 20 min   Charges:   PT Evaluation $PT Eval Low Complexity: 1 Procedure PT Treatments $Therapeutic Activity: 8-22 mins   PT G Codes:   PT G-Codes **NOT FOR INPATIENT CLASS** Functional Assessment Tool Used: clinical judgement Functional Limitation: Mobility: Walking and moving around Mobility: Walking and Moving Around Current Status (U9811): At least 20 percent but less than 40 percent impaired, limited or restricted Mobility: Walking and Moving Around Goal Status [phone]): At least 1  percent but less than 20 percent impaired, limited or restricted    Latica Hohmann H. Manson Passey, PT, DPT, NCS 12/25/15, 3:55 PM [phone]

## 2015-12-25 NOTE — Progress Notes (Signed)
[facility] [place] [facility]   PATIENT NAME: Diana Braun    MR#:  [id number]  DATE OF BIRTH:  25-Dec-1922  SUBJECTIVE:  CHIEF COMPLAINT:   Chief Complaint  Patient presents with  . Fall  . Head Injury  . Dizziness  Patient is a 82 year old female with who presents to the hospital with dizziness, fall. The patient was diagnosed with a urinary tract infection and initiated on Rocephin as well as IV fluids in the emergency room. She feels better today, with rehydration and therapy for urinary tract infection.   Review of Systems  Respiratory: Negative for cough and shortness of breath.   Cardiovascular: Negative for chest pain.  Gastrointestinal: Negative for abdominal pain, nausea and vomiting.  All other systems reviewed and are negative.   VITAL SIGNS: Blood pressure (!) 154/62, pulse 75, temperature 98.4 F (36.9 C), temperature source Oral, resp. rate 18, height 4\' 9"  (1.448 m), weight 53.8 kg (118 lb 9.6 oz), SpO2 97 %.  PHYSICAL EXAMINATION:   GENERAL:  82 y.o.-year-old patient lying in the bed with no acute distress. Swelling and the subcutaneous hepatoma in the right facial region with extension to the right neck, significant induration in submandibular area on the right EYES: Pupils equal, round, reactive to light and accommodation. No scleral icterus. Extraocular muscles intact.  HEENT: Head atraumatic, normocephalic. Oropharynx and nasopharynx clear.  NECK:  Supple, no jugular venous distention. No thyroid enlargement, no tenderness.  LUNGS: Normal breath sounds bilaterally, no wheezing, rales,rhonchi or crepitation. No use of accessory muscles of respiration.  CARDIOVASCULAR: S1, S2 normal. No murmurs, rubs, or gallops.  ABDOMEN: Soft, nontender, nondistended. Bowel sounds present. No organomegaly or mass.  EXTREMITIES: No pedal edema, cyanosis, or clubbing.  NEUROLOGIC: Cranial nerves II through XII are intact. Muscle strength 5/5 in all  extremities. Sensation intact. Gait not checked.  PSYCHIATRIC: The patient is alert and oriented x 3.  SKIN: No obvious rash, lesion, or ulcer.   ORDERS/RESULTS REVIEWED:   CBC  Recent Labs Lab 12/24/15 0801 12/25/15 0043  WBC 11.6* 8.5  HGB 11.6* 10.7*  HCT 33.5* 30.8*  PLT 167 151  MCV 93.8 94.5  MCH 32.3 32.8  MCHC 34.5 34.7  RDW 14.1 14.0  LYMPHSABS 1.2  --   MONOABS 0.5  --   EOSABS 0.1  --   BASOSABS 0.1  --    ------------------------------------------------------------------------------------------------------------------  Chemistries   Recent Labs Lab 12/24/15 0801 12/25/15 0043  NA 135 136  K 5.0 4.4  CL 104 108  CO2 25 23  GLUCOSE 159* 129*  BUN 35* 26*  CREATININE 1.15* 0.95  CALCIUM 9.4 8.4*  MG 1.5*  --   AST 33  --   ALT 23  --   ALKPHOS 50  --   BILITOT 0.6  --    ------------------------------------------------------------------------------------------------------------------ estimated creatinine clearance is 27.2 mL/min (by C-G formula based on SCr of 0.95 mg/dL). ------------------------------------------------------------------------------------------------------------------  Recent Labs  12/24/15 1243  TSH 1.952    Cardiac Enzymes  Recent Labs Lab 12/24/15 1243 12/24/15 1902 12/25/15 0043  TROPONINI <0.03 0.03* <0.03   ------------------------------------------------------------------------------------------------------------------ Invalid input(s): POCBNP ---------------------------------------------------------------------------------------------------------------  RADIOLOGY: Dg Chest 2 View  Result Date: 12/24/2015 CLINICAL DATA:  Got up to go to the bathroom at 0300 hours, became dizzy and fell striking RIGHT side of face, no loss of consciousness, RIGHT facial swelling and bruising, RIGHT side neck pain, history thyroid disease EXAM: CHEST  2 VIEW COMPARISON:  05/25/2013 FINDINGS: Borderline enlargement  of cardiac  silhouette. Calcified tortuous aorta. Mediastinal contours pulmonary vascularity normal. Chronic interstitial prominence with without infiltrate, pleural effusion or pneumothorax. Bones demineralized with healing fracture of the posterolateral LEFT seventh rib. IMPRESSION: Borderline enlargement of cardiac silhouette. Chronic interstitial lung disease. Aortic atherosclerosis pre Electronically Signed   By: Ulyses Southward M.D.   On: 12/24/2015 08:43   Ct Head Wo Contrast  Result Date: 12/24/2015 CLINICAL DATA:  Fall. EXAM: CT HEAD WITHOUT CONTRAST CT MAXILLOFACIAL WITHOUT CONTRAST CT CERVICAL SPINE WITHOUT CONTRAST TECHNIQUE: Multidetector CT imaging of the head, cervical spine, and maxillofacial structures were performed using the standard protocol without intravenous contrast. Multiplanar CT image reconstructions of the cervical spine and maxillofacial structures were also generated. COMPARISON:  None. FINDINGS: CT HEAD FINDINGS Prominence of the sulci and ventricles are identified compatible with brain atrophy. There is diffuse low attenuation within the subcortical and periventricular white matter compatible with chronic microvascular disease. No abnormal extra-axial fluid collection, intracranial hemorrhage or mass identified. No evidence for acute brain infarct. The mastoid air cells appear clear. The calvarium is intact. CT MAXILLOFACIAL FINDINGS The mastoid air cells are clear. There is mild mucosal thickening involving the maxillaries sinuses. No orbital blowout fracture. The mandible is located. The visualized portions of the mandible are intact. The nasal bone is intact. The nasal septum is midline. There is a 4.5 cm hyperdense mass within the soft tissues overlying the right side of face. This is compatible with a hematoma. CT CERVICAL SPINE FINDINGS Normal alignment of the cervical spine. The vertebral body heights are well preserved. The facet joints are appear all well aligned. Multi level disc space  narrowing and ventral spurring noted compatible with degenerative disc disease. No fractures or subluxations identified. IMPRESSION: 1. No acute intracranial abnormalities. Small vessel ischemic disease and brain atrophy. 2. No evidence for facial bone fracture. There is a large hematoma involving the soft tissues of the right-side of face. 3. No evidence for cervical spine fracture. There is multi level cervical degenerative disc disease Electronically Signed   By: Signa Kell M.D.   On: 12/24/2015 09:17   Ct Cervical Spine Wo Contrast  Result Date: 12/24/2015 CLINICAL DATA:  Fall. EXAM: CT HEAD WITHOUT CONTRAST CT MAXILLOFACIAL WITHOUT CONTRAST CT CERVICAL SPINE WITHOUT CONTRAST TECHNIQUE: Multidetector CT imaging of the head, cervical spine, and maxillofacial structures were performed using the standard protocol without intravenous contrast. Multiplanar CT image reconstructions of the cervical spine and maxillofacial structures were also generated. COMPARISON:  None. FINDINGS: CT HEAD FINDINGS Prominence of the sulci and ventricles are identified compatible with brain atrophy. There is diffuse low attenuation within the subcortical and periventricular white matter compatible with chronic microvascular disease. No abnormal extra-axial fluid collection, intracranial hemorrhage or mass identified. No evidence for acute brain infarct. The mastoid air cells appear clear. The calvarium is intact. CT MAXILLOFACIAL FINDINGS The mastoid air cells are clear. There is mild mucosal thickening involving the maxillaries sinuses. No orbital blowout fracture. The mandible is located. The visualized portions of the mandible are intact. The nasal bone is intact. The nasal septum is midline. There is a 4.5 cm hyperdense mass within the soft tissues overlying the right side of face. This is compatible with a hematoma. CT CERVICAL SPINE FINDINGS Normal alignment of the cervical spine. The vertebral body heights are well  preserved. The facet joints are appear all well aligned. Multi level disc space narrowing and ventral spurring noted compatible with degenerative disc disease. No fractures or subluxations identified. IMPRESSION: 1.  No acute intracranial abnormalities. Small vessel ischemic disease and brain atrophy. 2. No evidence for facial bone fracture. There is a large hematoma involving the soft tissues of the right-side of face. 3. No evidence for cervical spine fracture. There is multi level cervical degenerative disc disease Electronically Signed   By: Signa Kell M.D.   On: 12/24/2015 09:17   Ct Maxillofacial Wo Contrast  Result Date: 12/24/2015 CLINICAL DATA:  Fall. EXAM: CT HEAD WITHOUT CONTRAST CT MAXILLOFACIAL WITHOUT CONTRAST CT CERVICAL SPINE WITHOUT CONTRAST TECHNIQUE: Multidetector CT imaging of the head, cervical spine, and maxillofacial structures were performed using the standard protocol without intravenous contrast. Multiplanar CT image reconstructions of the cervical spine and maxillofacial structures were also generated. COMPARISON:  None. FINDINGS: CT HEAD FINDINGS Prominence of the sulci and ventricles are identified compatible with brain atrophy. There is diffuse low attenuation within the subcortical and periventricular white matter compatible with chronic microvascular disease. No abnormal extra-axial fluid collection, intracranial hemorrhage or mass identified. No evidence for acute brain infarct. The mastoid air cells appear clear. The calvarium is intact. CT MAXILLOFACIAL FINDINGS The mastoid air cells are clear. There is mild mucosal thickening involving the maxillaries sinuses. No orbital blowout fracture. The mandible is located. The visualized portions of the mandible are intact. The nasal bone is intact. The nasal septum is midline. There is a 4.5 cm hyperdense mass within the soft tissues overlying the right side of face. This is compatible with a hematoma. CT CERVICAL SPINE FINDINGS  Normal alignment of the cervical spine. The vertebral body heights are well preserved. The facet joints are appear all well aligned. Multi level disc space narrowing and ventral spurring noted compatible with degenerative disc disease. No fractures or subluxations identified. IMPRESSION: 1. No acute intracranial abnormalities. Small vessel ischemic disease and brain atrophy. 2. No evidence for facial bone fracture. There is a large hematoma involving the soft tissues of the right-side of face. 3. No evidence for cervical spine fracture. There is multi level cervical degenerative disc disease Electronically Signed   By: Signa Kell M.D.   On: 12/24/2015 09:17    EKG:  Orders placed or performed during the hospital encounter of 12/24/15  . EKG 12-Lead  . EKG 12-Lead  . EKG 12-Lead  . EKG 12-Lead    ASSESSMENT AND PLAN:  Active Problems:   Dizziness   Fall   Facial hematoma   Acute renal insufficiency   Hyperglycemia   Hypomagnesemia   Leukocytosis   Anemia   UTI (lower urinary tract infection)   A-fib (HCC) #1. Dizziness, possibly related to infection, orthostatic vital signs revealed about 20 mmHg drop while patient was sitting, continue IV fluids until tomorrow and reassess tomorrow morning. Awaiting for physical therapist  recommendations #2. Fall due to generalized weakness, dizziness, getting physical therapist involved recommendations #3. Facial hematoma, supportive therapy, no significant improvement so far, discontinue Lovenox #4 acute renal insufficiency,  resolved on  low rate IV fluids, likely dehydration and infection related  #5. Hyperglycemia, patient admited of having diagnosis of diabetes in the past, however, her diabetes resolved, hemoglobin A1c was found to be 6.2, patient is prediabetic, continue diabetic diet, continue patient on sliding scale insulin while in the hospital, patient's hyperglycemia may be the reason why she is dehydrated.  #6. Hypomagnesemia,  supplementing orally #7. Leukocytosis, likely due to stress and urinary tract infection, resolved with therapy #8. Urinary tract infection due to group B streptococcus, discontinue Rocephin, initiate patient on amoxicillin #9. Atrial fibrillation,  likely paroxysmal, and tinea metoprolol, patient converted to sinus rhythm, cardiac enzymes 3 showed mild elevation of troponin to 0.03 on the third set, but subsequent set was normal, TSH was normal at 1.95, echocardiogram revealed normal ejection fraction, severe tricuspid regurgitation and pulmonary hypertension, appreciate cardiology input, initiate  anticoagulation as outpatient, follow-up with cardiologist as outpatient   Management plans discussed with the patient, family and they are in agreement.   DRUG ALLERGIES: No Known Allergies  CODE STATUS:     Code Status Orders        Start     Ordered   12/24/15 1218  Full code  Continuous     12/24/15 1217    Code Status History    Date Active Date Inactive Code Status Order ID Comments User Context   12/24/2015 12:17 PM 12/24/2015  5:17 PM Full Code [id number]  Katharina Caper, MD Inpatient      TOTAL TIME TAKING CARE OF THIS PATIENT: 40 minutes.  Discussed with patient's daughter  Katharina Caper M.D on 12/25/2015 at 3:10 PM  Between 7am to 6pm - Pager - [phone]  After 6pm go to [email or web address] - password [email or web address] [facility]  [facility]  Office  [phone]  CC: Primary care physician; Emogene Morgan, MD

## 2015-12-25 NOTE — Clinical Social Work Note (Signed)
MSW received referral for SNF.  Case discussed with case manager and plan is to discharge home with home health.  MSW to sign off please re-consult if social work needs arise.  Kaidyn Javid R. Rafi Kenneth, MSW Mon-Fri 8a-4:30p [phone]   

## 2015-12-26 LAB — GLUCOSE, CAPILLARY
GLUCOSE-CAPILLARY: 129 mg/dL — AB (ref 65–99)
GLUCOSE-CAPILLARY: 106 mg/dL — AB (ref 65–99)
Glucose-Capillary: 88 mg/dL (ref 65–99)

## 2015-12-26 LAB — HEMOGLOBIN: HEMOGLOBIN: 11.4 g/dL — AB (ref 12.0–16.0)

## 2015-12-26 MED ORDER — METOPROLOL TARTRATE 25 MG PO TABS: 25.0000 mg | ORAL_TABLET | Freq: Two times a day (BID) | ORAL | 0 refills | Status: DC

## 2015-12-26 MED ORDER — AMOXICILLIN 500 MG PO CAPS: 500.0000 mg | ORAL_CAPSULE | Freq: Two times a day (BID) | ORAL | 0 refills | Status: DC

## 2015-12-26 NOTE — Progress Notes (Signed)
Discharge instructions along with home medication list gone over with patient and daughter. Verbalized understanding. No printed rx given to patient, made aware prescriptions were electronically submitted to pharmacy. Iv and telemetry removed. No c/o pain no  Distress noted. Patient to be discharged home on ra. Will have home health.

## 2015-12-26 NOTE — Care Management (Signed)
Referral to [facility] ( no choice) for SN and PT.  It is anticipated that patient will discharge today. Updated daughter.

## 2015-12-26 NOTE — Evaluation (Signed)
Clinical/Bedside Swallow Evaluation Patient Details  Name: Diana Braun Randa MRN: [id number] Date of Birth: [birth year]  Today's Date: 12/26/2015 Time: SLP Start Time (ACUTE ONLY): 0945 SLP Stop Time (ACUTE ONLY): 1045 SLP Time Calculation (min) (ACUTE ONLY): 60 min  Past Medical History:  Past Medical History:  Diagnosis Date  . Thyroid disease    Past Surgical History: History reviewed. No pertinent surgical history. HPI:  Pt is a 82 y.o. female with a known history of Diabetes mellitus, hypothyroidism, urinary tract infections, atrial fibrillation, who presents to the hospital with complaints of fall. According to patient as well as her sister, patient woke up in the morning to go to bedside commode, she became dizzy and landed on the floor. Patient does not remember falling. She remembers sitting up on the floor. On arrival to the hospital patient was noted to be in atrial fibrillation at rate of 80, her labs revealed urinary tract infection. Today, patient has significant swelling of the right orbital region and right side of her face with some edema and ecchymosis sliding down to her left shoulder. She is in good spirits this morning and is hoping to go home. She is able to verbally communicate w/ family, SLP. She ate breakfast this morning w/ family; denied any trouble swallowing.    Assessment / Plan / Recommendation Clinical Impression  Pt appeared to adequately tolerate trials of thin liquids and purees from recommended diet modification yesterday post admission w/ significant R facial/mouth bruising; lingual movements appeared wfl. Pt consumed trials, mostly liquids, w/ no overt s/s of aspiration noted; vocal quality clear b/t trials and no decline in respiratory status during/post trials. Pt exhibited no oral phase deficits w/ the puree consistency; Dtr stated pt could eat rice and soft veggies last night w/ her. Discussed option of diet upgrade to a Dysphagia 2 w/ thin liquids w/ general  aspiration precautions and strategy of lingual sweeping and alternating food w/ liquid sips to aid oral clearing. Pt and family agreed. Pt appears at an adequate baseline w/ oral diet w/ the facial bruising present. ST will be available for any further education/assessment while admitted. NSG updated. Pt/family agreed.     Aspiration Risk   (reduced w/ general aspiration precautions)    Diet Recommendation  Dysphagia 2(chopped and moistened); thin liquids. General aspiration precautions; strategies of lingual sweeping and alternating food w/ liquid to aid oral clearing.   Medication Administration: Whole meds with puree    Other  Recommendations Recommended Consults:  (Dietician f/u if indicated) Oral Care Recommendations: Oral care BID;Patient independent with oral care   Follow up Recommendations  None    Frequency and Duration            Prognosis Prognosis for Safe Diet Advancement: Good      Swallow Study   General Date of Onset: 12/24/15 HPI: Pt is a 82 y.o. female with a known history of Diabetes mellitus, hypothyroidism, urinary tract infections, atrial fibrillation, who presents to the hospital with complaints of fall. According to patient as well as her sister, patient woke up in the morning to go to bedside commode, she became dizzy and landed on the floor. Patient does not remember falling. She remembers sitting up on the floor. On arrival to the hospital patient was noted to be in atrial fibrillation at rate of 80, her labs revealed urinary tract infection. Today, patient has significant swelling of the right orbital region and right side of her face with some edema and ecchymosis sliding down  to her left shoulder. She is in good spirits this morning and is hoping to go home. She is able to verbally communicate w/ family, SLP. She ate breakfast this morning w/ family; denied any trouble swallowing.  Type of Study: Bedside Swallow Evaluation Previous Swallow Assessment:  none Diet Prior to this Study: Dysphagia 3 (soft);Thin liquids (per family description) Temperature Spikes Noted: No (wbc 8.5) Respiratory Status: Room air History of Recent Intubation: No Behavior/Cognition: Alert;Cooperative;Pleasant mood;Distractible;Requires cueing (min) Oral Cavity Assessment: Within Functional Limits Oral Care Completed by SLP: Recent completion by staff Oral Cavity - Dentition: Edentulous Vision: Functional for self-feeding Self-Feeding Abilities: Able to feed self;Needs assist;Needs set up Patient Positioning: Upright in bed Baseline Vocal Quality: Normal Volitional Cough: Strong Volitional Swallow: Able to elicit    Oral/Motor/Sensory Function Overall Oral Motor/Sensory Function: Moderate impairment (bruising on R side of face/mouth; lingual movement wfl) Lingual ROM: Within Functional Limits Lingual Symmetry: Within Functional Limits Lingual Strength: Within Functional Limits   Ice Chips Ice chips: Not tested   Thin Liquid Thin Liquid: Within functional limits Presentation: Straw;Self Fed (several sips)    Nectar Thick Nectar Thick Liquid: Not tested   Honey Thick Honey Thick Liquid: Not tested   Puree Puree: Within functional limits Presentation: Self Fed;Spoon (several bites) Other Comments: consumed po trials of breakfast meal   Solid   GO   Solid: Not tested Other Comments: d/t bruising of R side of face/mouth    Functional Assessment Tool Used: clinical judgement Functional Limitations: Swallowing Swallow Current Status (Z6109): At least 1 percent but less than 20 percent impaired, limited or restricted Swallow Goal Status [phone]): At least 1 percent but less than 20 percent impaired, limited or restricted Swallow Discharge Status [phone]): At least 1 percent but less than 20 percent impaired, limited or restricted    Jerilynn Som, MS, CCC-SLP  Watson,Katherine 12/26/2015,11:31 AM

## 2015-12-26 NOTE — Discharge Summary (Signed)
[facility] [place] [facility]   PATIENT NAME: Diana Braun    MR#:  [id number]  DATE OF BIRTH:  [birth year]  DATE OF ADMISSION:  12/24/2015 ADMITTING PHYSICIAN: Katharina Caper, MD  DATE OF DISCHARGE: 12/26/2015  1:51 PM  PRIMARY CARE PHYSICIAN: Karie Fetch A, MD     ADMISSION DIAGNOSIS:  Dizziness [R42] UTI (lower urinary tract infection) [N39.0] Irregular heart beat [I49.9] Fall, initial encounter [W19.XXXA] Head injury, initial encounter [S09.90XA]  DISCHARGE DIAGNOSIS:  Principal Problem:   Dizziness Active Problems:   Fall   Facial hematoma   Acute renal insufficiency   Hyperglycemia   UTI (lower urinary tract infection)   A-fib (HCC)   Hypomagnesemia   Leukocytosis   Anemia   SECONDARY DIAGNOSIS:   Past Medical History:  Diagnosis Date  . Thyroid disease     .pro HOSPITAL COURSE:  Patient is a 82 year old female with who presents to the hospital with dizziness, fall. The patient was diagnosed with a urinary tract infection and initiated on Rocephin as well as IV fluids in the emergency room. Patient's urinary cultures came back positive for group B Streptococcus 60,000 colony-forming units, antibiotic was changed to oral ampicillin to be continued for 5 more days. The patient was seen by physical therapist and recommended home health services with nurse and physical therapy. It was felt that patient is stable to be discharged home today.  Discussion by problem: #1. Dizziness, likely related to dehydration/orthostatic hypotension and infection, orthostatic vital signs revealed about 20 mmHg drop while patient was sitting, the patient received IV fluids while in the hospital. Orthostatic vital signs, repeated prior to discharge normalized. Patient was seen by physical therapist and recommended home health services which are going to prescribed upon discharge.  #2. Fall due to generalized weakness, dizziness, as above  physical therapist  recommended home health services.  #3. Facial hematoma, improved with supportive therapy,, hemoglobin level was followed while in the hospital remained stable.  #4 acute renal insufficiency,  resolved on  low rate IV fluids, likely dehydration and infection related  #5. Hyperglycemia, patient admited of having diagnosis of diabetes in the past, however, she thought her diabetes had resolved, however , hemoglobin A1c  checked in the hospital was found to be 6.2, patient was felt to be prediabetic and is prone to hyperglycemia , which may be the reason why she was dehydrated.  #6. Hypomagnesemia, supplemented  orally #7. Leukocytosis, likely due to stress and urinary tract infection, resolved with therapy #8. Urinary tract infection due to group B streptococcus,  continue amoxicillin for 5 more days to complete course #9. Atrial fibrillation, likely paroxysmal, continue  metoprolol, patient converted to sinus rhythm, cardiac enzymes 3 showed mild elevation of troponin to 0.03 on the third set, but subsequent set was normal, TSH was normal at 1.95, echocardiogram revealed normal ejection fraction, severe tricuspid regurgitation and pulmonary hypertension,  cardiologist recommended to follow-up with them as outpatient and initiate anticoagulation therapy as outpatient  DISCHARGE CONDITIONS:   Stable  CONSULTS OBTAINED:  Treatment Team:  Lamar Blinks, MD Laurier Nancy, MD  DRUG ALLERGIES:  No Known Allergies  DISCHARGE MEDICATIONS:   Discharge Medication List as of 12/26/2015 12:51 PM    START taking these medications   Details  amoxicillin (AMOXIL) 500 MG capsule Take 1 capsule (500 mg total) by mouth 2 (two) times daily., Starting Wed 12/26/2015, Normal    metoprolol tartrate (LOPRESSOR) 25 MG tablet Take 1 tablet (25 mg  total) by mouth 2 (two) times daily., Starting Wed 12/26/2015, Normal      CONTINUE these medications which have NOT CHANGED   Details  levothyroxine (SYNTHROID,  LEVOTHROID) 50 MCG tablet Take 50 mcg by mouth daily., Historical Med         DISCHARGE INSTRUCTIONS:    Patient is to follow-up with primary care physician, cardiologist as outpatient  If you experience worsening of your admission symptoms, develop shortness of breath, life threatening emergency, suicidal or homicidal thoughts you must seek medical attention immediately by calling 911 or calling your MD immediately  if symptoms less severe.  You Must read complete instructions/literature along with all the possible adverse reactions/side effects for all the Medicines you take and that have been prescribed to you. Take any new Medicines after you have completely understood and accept all the possible adverse reactions/side effects.   Please note  You were cared for by a hospitalist during your hospital stay. If you have any questions about your discharge medications or the care you received while you were in the hospital after you are discharged, you can call the unit and asked to speak with the hospitalist on call if the hospitalist that took care of you is not available. Once you are discharged, your primary care physician will handle any further medical issues. Please note that NO REFILLS for any discharge medications will be authorized once you are discharged, as it is imperative that you return to your primary care physician (or establish a relationship with a primary care physician if you do not have one) for your aftercare needs so that they can reassess your need for medications and monitor your lab values.    Today   CHIEF COMPLAINT:   Chief Complaint  Patient presents with  . Fall  . Head Injury  . Dizziness    HISTORY OF PRESENT ILLNESS:  Diana Braun  is a 82 y.o. female with a known history of Diabetes, hypothyroidism, who presentsto the hospital with dizziness, fall. The patient was diagnosed with a urinary tract infection and initiated on Rocephin as well as IV fluids in  the emergency room. Patient's urinary cultures came back positive for group B Streptococcus 60,000 colony-forming units, antibiotic was changed to oral ampicillin to be continued for 5 more days. The patient was seen by physical therapist and recommended home health services with nurse and physical therapy. It was felt that patient is stable to be discharged home today.  Discussion by problem: #1. Dizziness, likely related to dehydration/orthostatic hypotension and infection, orthostatic vital signs revealed about 20 mmHg drop while patient was sitting, the patient received IV fluids while in the hospital. Orthostatic vital signs, repeated prior to discharge normalized. Patient was seen by physical therapist and recommended home health services which are going to prescribed upon discharge.  #2. Fall due to generalized weakness, dizziness, as above  physical therapist recommended home health services.  #3. Facial hematoma, improved with supportive therapy,, hemoglobin level was followed while in the hospital remained stable.  #4 acute renal insufficiency,  resolved on  low rate IV fluids, likely dehydration and infection related  #5. Hyperglycemia, patient admited of having diagnosis of diabetes in the past, however, she thought her diabetes had resolved, however , hemoglobin A1c  checked in the hospital was found to be 6.2, patient was felt to be prediabetic and is prone to hyperglycemia , which may be the reason why she was dehydrated.  #6. Hypomagnesemia, supplemented  orally #7. Leukocytosis, likely  due to stress and urinary tract infection, resolved with therapy #8. Urinary tract infection due to group B streptococcus,  continue amoxicillin for 5 more days to complete course #9. Atrial fibrillation, likely paroxysmal, continue  metoprolol, patient converted to sinus rhythm, cardiac enzymes 3 showed mild elevation of troponin to 0.03 on the third set, but subsequent set was normal, TSH was normal at  1.95, echocardiogram revealed normal ejection fraction, severe tricuspid regurgitation and pulmonary hypertension,  cardiologist recommended to follow-up with them as outpatient and initiate anticoagulation therapy as outpatient      VITAL SIGNS:  Blood pressure (!) 134/48, pulse 60, temperature 99.3 F (37.4 C), temperature source Oral, resp. rate 16, height 4\' 9"  (1.448 m), weight 53.8 kg (118 lb 9.6 oz), SpO2 98 %.  I/O:   Intake/Output Summary (Last 24 hours) at 12/26/15 1637 Last data filed at 12/26/15 1223  Gross per 24 hour  Intake              360 ml  Output              650 ml  Net             -290 ml    PHYSICAL EXAMINATION:  GENERAL:  82 y.o.-year-old patient lying in the bed with no acute distress.  EYES: Pupils equal, round, reactive to light and accommodation. No scleral icterus. Extraocular muscles intact.  HEENT: Head atraumatic, normocephalic. Oropharynx and nasopharynx clear.  NECK:  Supple, no jugular venous distention. No thyroid enlargement, no tenderness.  LUNGS: Normal breath sounds bilaterally, no wheezing, rales,rhonchi or crepitation. No use of accessory muscles of respiration.  CARDIOVASCULAR: S1, S2 normal. No murmurs, rubs, or gallops.  ABDOMEN: Soft, non-tender, non-distended. Bowel sounds present. No organomegaly or mass.  EXTREMITIES: No pedal edema, cyanosis, or clubbing.  NEUROLOGIC: Cranial nerves II through XII are intact. Muscle strength 5/5 in all extremities. Sensation intact. Gait not checked.  PSYCHIATRIC: The patient is alert and oriented x 3.  SKIN: No obvious rash, lesion, or ulcer.   DATA REVIEW:   CBC  Recent Labs Lab 12/25/15 0043 12/26/15 1134  WBC 8.5  --   HGB 10.7* 11.4*  HCT 30.8*  --   PLT 151  --     Chemistries   Recent Labs Lab 12/24/15 0801 12/25/15 0043  NA 135 136  K 5.0 4.4  CL 104 108  CO2 25 23  GLUCOSE 159* 129*  BUN 35* 26*  CREATININE 1.15* 0.95  CALCIUM 9.4 8.4*  MG 1.5*  --   AST 33  --    ALT 23  --   ALKPHOS 50  --   BILITOT 0.6  --     Cardiac Enzymes  Recent Labs Lab 12/25/15 0043  TROPONINI <0.03    Microbiology Results  Results for orders placed or performed during the hospital encounter of 12/24/15  Urine culture     Status: Abnormal   Collection Time: 12/24/15  9:05 AM  Result Value Ref Range Status   Specimen Description URINE, RANDOM  Final   Special Requests NONE  Final   Culture (A)  Final    60,000 COLONIES/mL GROUP B STREP(S.AGALACTIAE)ISOLATED There is no known Penicillin Resistant Beta Streptococcus in the [place] For patients that are Penicillin-allergic, Erythromycin is 85-94% susceptible, and Clindamycin is 80% susceptible.  Contact Microbiology within 7 days if sensitivity testing is  required.   Virtually 100% of S. agalactiae (Group B) strains are susceptible to Penicillin.  For Penicillin-allergic  patients, Erythromycin (85-95% sensitive) and Clindamycin (80% sensitive) are drugs of choice. Contact microbiology lab to request sensitivities if  needed within 7 days. Performed at [facility]    Report Status 12/25/2015 FINAL  Final    RADIOLOGY:  No results found.  EKG:   Orders placed or performed during the hospital encounter of 12/24/15  . EKG 12-Lead  . EKG 12-Lead  . EKG 12-Lead  . EKG 12-Lead      Management plans discussed with the patient, family and they are in agreement.  CODE STATUS:     Code Status Orders        Start     Ordered   12/24/15 1218  Full code  Continuous     12/24/15 1217    Code Status History    Date Active Date Inactive Code Status Order ID Comments User Context   12/24/2015 12:17 PM 12/24/2015  5:17 PM Full Code [id number]  Katharina Caper, MD Inpatient      TOTAL TIME TAKING CARE OF THIS PATIENT: 40  minutes.    Katharina Caper M.D on 12/26/2015 at 4:37 PM  Between 7am to 6pm - Pager - [phone]  After 6pm go to [email or web address] - password [email or web address]  [facility]   Office  [phone]  CC: Primary care physician; Emogene Morgan, MD

## 2015-12-26 NOTE — Progress Notes (Signed)
SUBJECTIVE: The patient has significant swelling of the right orbital region and right side of her face with some edema and ecchymosis sliding down to her left shoulder. She is in good spirits this morning and is hoping to go home. Family members are at the bedside   Vitals:   12/25/15 0504 12/25/15 1110 12/25/15 2042 12/26/15 0522  BP: 134/62 (!) 154/62 (!) 141/47 140/60  Pulse: 64 75 70 72  Resp: 18  20 20   Temp: 98.6 F (37 C) 98.4 F (36.9 C) 98.9 F (37.2 C) 98.8 F (37.1 C)  TempSrc: Oral Oral Axillary Oral  SpO2: 97% 97% 97% 95%  Weight:      Height:        Intake/Output Summary (Last 24 hours) at 12/26/15 0946 Last data filed at 12/26/15 0700  Gross per 24 hour  Intake                0 ml  Output              750 ml  Net             -750 ml    LABS: Basic Metabolic Panel:  Recent Labs  10/96/406/12/03 0801 12/25/15 0043  NA 135 136  K 5.0 4.4  CL 104 108  CO2 25 23  GLUCOSE 159* 129*  BUN 35* 26*  CREATININE 1.15* 0.95  CALCIUM 9.4 8.4*  MG 1.5*  --    Liver Function Tests:  Recent Labs  12/24/15 0801  AST 33  ALT 23  ALKPHOS 50  BILITOT 0.6  PROT 8.2*  ALBUMIN 4.0   No results for input(s): LIPASE, AMYLASE in the last 72 hours. CBC:  Recent Labs  12/24/15 0801 12/25/15 0043  WBC 11.6* 8.5  NEUTROABS 9.7*  --   HGB 11.6* 10.7*  HCT 33.5* 30.8*  MCV 93.8 94.5  PLT 167 151   Cardiac Enzymes:  Recent Labs  12/24/15 1243 12/24/15 1902 12/25/15 0043  TROPONINI <0.03 0.03* <0.03   BNP: Invalid input(s): POCBNP D-Dimer: No results for input(s): DDIMER in the last 72 hours. Hemoglobin A1C:  Recent Labs  12/24/15 1243  HGBA1C 6.2*   Fasting Lipid Panel: No results for input(s): CHOL, HDL, LDLCALC, TRIG, CHOLHDL, LDLDIRECT in the last 72 hours. Thyroid Function Tests:  Recent Labs  12/24/15 1243  TSH 1.952   Anemia Panel: No results for input(s): VITAMINB12, FOLATE, FERRITIN, TIBC, IRON, RETICCTPCT in the last 72  hours.   PHYSICAL EXAM General: Well developed, well nourished, in no acute distress HEENT:  Significant ecchymosis and edema of the left orbital and cheek Neck:  No JVD.  Lungs: Clear bilaterally to auscultation and percussion. Heart: HRRR . Normal S1 and S2 without gallops or murmurs.  Abdomen: Bowel sounds are positive, abdomen soft and non-tender  Msk:  Back normal, normal gait. Normal strength and tone for age. Extremities: No clubbing, cyanosis or edema.   Neuro: Alert and oriented X 3. Psych:  Good affect, responds appropriately  TELEMETRY: Normal sinus rhythm 72 bpm  ASSESSMENT AND PLAN: Chronic atrial fibrillation. Fall resulting in left facial ecchymosis and edema compounded by warfarin therapy. Will allow hematoma to resolve and consider a novel oral anticoagulation agent in the future. May be discharged from cardiac standpoint.  Active Problems:   Dizziness   Fall   Facial hematoma   Acute renal insufficiency   Hyperglycemia   Hypomagnesemia   Leukocytosis   Anemia   UTI (lower urinary tract infection)  A-fib [facility])    Berton Bon, NP 12/26/2015 9:46 AM

## 2016-01-04 ENCOUNTER — Emergency Department: Payer: Medicare Other

## 2016-01-04 ENCOUNTER — Encounter: Payer: Self-pay | Admitting: Emergency Medicine

## 2016-01-04 ENCOUNTER — Emergency Department
Admission: EM | Admit: 2016-01-04 | Discharge: 2016-01-04 | Disposition: A | Discharge status: Home / Self Care | Payer: Medicare Other | Attending: Emergency Medicine | Admitting: Emergency Medicine

## 2016-01-04 DIAGNOSIS — S0083XD Contusion of other part of head, subsequent encounter: Secondary | ICD-10-CM | POA: Insufficient documentation

## 2016-01-04 DIAGNOSIS — I10 Essential (primary) hypertension: Secondary | ICD-10-CM | POA: Diagnosis not present

## 2016-01-04 DIAGNOSIS — R51 Headache: Secondary | ICD-10-CM | POA: Diagnosis not present

## 2016-01-04 DIAGNOSIS — R519 Headache, unspecified: Secondary | ICD-10-CM

## 2016-01-04 DIAGNOSIS — E119 Type 2 diabetes mellitus without complications: Secondary | ICD-10-CM | POA: Insufficient documentation

## 2016-01-04 DIAGNOSIS — M542 Cervicalgia: Secondary | ICD-10-CM | POA: Diagnosis not present

## 2016-01-04 DIAGNOSIS — W19XXXD Unspecified fall, subsequent encounter: Secondary | ICD-10-CM | POA: Diagnosis not present

## 2016-01-04 DIAGNOSIS — R531 Weakness: Secondary | ICD-10-CM | POA: Insufficient documentation

## 2016-01-04 HISTORY — DX: Hyperlipidemia, unspecified: E78.5

## 2016-01-04 HISTORY — DX: Essential (primary) hypertension: I10

## 2016-01-04 HISTORY — DX: Type 2 diabetes mellitus without complications: E11.9

## 2016-01-04 HISTORY — DX: Anemia, unspecified: D64.9

## 2016-01-04 LAB — CBC
HEMATOCRIT: 35.3 % (ref 35.0–47.0)
HEMOGLOBIN: 12 g/dL (ref 12.0–16.0)
MCH: 32.1 pg (ref 26.0–34.0)
MCHC: 34.1 g/dL (ref 32.0–36.0)
MCV: 94 fL (ref 80.0–100.0)
Platelets: 269 10*3/uL (ref 150–440)
RBC: 3.75 MIL/uL — AB (ref 3.80–5.20)
RDW: 14.1 % (ref 11.5–14.5)
WBC: 10.7 10*3/uL (ref 3.6–11.0)

## 2016-01-04 LAB — COMPREHENSIVE METABOLIC PANEL
ALBUMIN: 4.2 g/dL (ref 3.5–5.0)
ALT: 35 U/L (ref 14–54)
ANION GAP: 9 (ref 5–15)
AST: 35 U/L (ref 15–41)
Alkaline Phosphatase: 70 U/L (ref 38–126)
BILIRUBIN TOTAL: 0.8 mg/dL (ref 0.3–1.2)
BUN: 30 mg/dL — AB (ref 6–20)
CHLORIDE: 103 mmol/L (ref 101–111)
CO2: 23 mmol/L (ref 22–32)
Calcium: 9.6 mg/dL (ref 8.9–10.3)
Creatinine, Ser: 1.03 mg/dL — ABNORMAL HIGH (ref 0.44–1.00)
GFR calc Af Amer: 53 mL/min — ABNORMAL LOW (ref >60)
GFR calc non Af Amer: 46 mL/min — ABNORMAL LOW (ref >60)
GLUCOSE: 96 mg/dL (ref 65–99)
POTASSIUM: 4.4 mmol/L (ref 3.5–5.1)
SODIUM: 135 mmol/L (ref 135–145)
TOTAL PROTEIN: 8.7 g/dL — AB (ref 6.5–8.1)

## 2016-01-04 LAB — URINALYSIS COMPLETE WITH MICROSCOPIC (ARMC ONLY)
Bilirubin Urine: NEGATIVE
Glucose, UA: NEGATIVE mg/dL
Hgb urine dipstick: NEGATIVE
KETONES UR: NEGATIVE mg/dL
LEUKOCYTES UA: NEGATIVE
Nitrite: NEGATIVE
PH: 7 (ref 5.0–8.0)
PROTEIN: NEGATIVE mg/dL
SPECIFIC GRAVITY, URINE: 1.011 (ref 1.005–1.030)

## 2016-01-04 LAB — SEDIMENTATION RATE: SED RATE: 92 mm/h — AB (ref 0–30)

## 2016-01-04 LAB — TROPONIN I: Troponin I: <0.03 ng/mL (ref <0.03)

## 2016-01-04 MED ORDER — CEPHALEXIN 500 MG PO CAPS: 500.0000 mg | ORAL_CAPSULE | Freq: Three times a day (TID) | ORAL | 0 refills | Status: DC

## 2016-01-04 MED ORDER — PREDNISONE 20 MG PO TABS: 20.0000 mg | ORAL_TABLET | Freq: Every day | ORAL | 0 refills | Status: DC

## 2016-01-04 MED ORDER — IOPAMIDOL (ISOVUE-370) INJECTION 76%: 75.0000 mL | Freq: Once | INTRAVENOUS | Status: DC | PRN

## 2016-01-04 MED ORDER — LEVOTHYROXINE SODIUM 50 MCG PO TABS
50.0000 ug | ORAL_TABLET | ORAL | Status: AC
  Administered 2016-01-04: 50 ug via ORAL
  Filled 2016-01-04: qty 1

## 2016-01-04 MED ORDER — CEPHALEXIN 500 MG PO CAPS
500.0000 mg | ORAL_CAPSULE | Freq: Once | ORAL | Status: AC
  Administered 2016-01-04: 500 mg via ORAL
  Filled 2016-01-04: qty 1

## 2016-01-04 MED ORDER — PREDNISONE 20 MG PO TABS
20.0000 mg | ORAL_TABLET | ORAL | Status: AC
  Administered 2016-01-04: 20 mg via ORAL
  Filled 2016-01-04: qty 1

## 2016-01-04 NOTE — ED Notes (Signed)
Patient transported to CT 

## 2016-01-04 NOTE — ED Provider Notes (Signed)
[facility] Emergency Department Provider Note  ____________________________________________  Time seen: Approximately 4:30 PM  I have reviewed the triage vital signs and the nursing notes.   HISTORY  Chief Complaint Loss of Consciousness  History obtained from patient and family members at bedside. Family interpreted for the patient at the patient's preference.  HPI Diana Braun is a 82 y.o. female who is recently in the hospital for falls resulting in a large facial hematomathat seem to be caused from weakness related to urinary tract infection. She was discharged from the hospital 10 days ago. She completed her antibiotics. Today, she was returning to her bed from her bedside toilet when she became weak and fell onto the bed. She reported the family later that she was shaking, but she did not lose consciousness. There was no incontinence. Never had any history of seizures. No new head trauma. No chest pain shortness of breath or abdominal pain. No fevers chills or sweats. Has been eating as usual..     Past Medical History:  Diagnosis Date  . Anemia   . Diabetes mellitus without complication ([place])   . Hyperlipidemia   . Hypertension   . Thyroid disease      Patient Active Problem List   Diagnosis Date Noted  . Dizziness 12/24/2015  . Fall 12/24/2015  . Facial hematoma 12/24/2015  . Acute renal insufficiency 12/24/2015  . Hyperglycemia 12/24/2015  . Hypomagnesemia 12/24/2015  . Leukocytosis 12/24/2015  . Anemia 12/24/2015  . UTI (lower urinary tract infection) 12/24/2015  . A-fib ([place]) 12/24/2015     Past Surgical History:  Procedure Laterality Date  . HIP SURGERY Right   . KNEE SURGERY Right      Prior to Admission medications   Medication Sig Start Date End Date Taking? Authorizing Provider  amoxicillin (AMOXIL) 500 MG capsule Take 1 capsule (500 mg total) by mouth 2 (two) times daily. 12/26/15   Theodoro Grist, MD  cephALEXin (KEFLEX)  500 MG capsule Take 1 capsule (500 mg total) by mouth 3 (three) times daily. 01/04/16   Carrie Mew, MD  levothyroxine (SYNTHROID, LEVOTHROID) 50 MCG tablet Take 50 mcg by mouth daily.    Historical Provider, MD  metoprolol tartrate (LOPRESSOR) 25 MG tablet Take 1 tablet (25 mg total) by mouth 2 (two) times daily. 12/26/15   Theodoro Grist, MD  predniSONE (DELTASONE) 20 MG tablet Take 1 tablet (20 mg total) by mouth daily. 01/04/16   Carrie Mew, MD     Allergies Review of patient's allergies indicates no known allergies.   History reviewed. No pertinent family history.  Social History Social History  Substance Use Topics  . Smoking status: Never Smoker  . Smokeless tobacco: Never Used  . Alcohol use No    Review of Systems  Constitutional:   No fever or chills.  ENT:   No sore throat. No rhinorrhea. Cardiovascular:   No chest pain. Respiratory:   No dyspnea or cough. Gastrointestinal:   Negative for abdominal pain, vomiting and diarrhea.  Genitourinary:   Negative for dysuria or difficulty urinating. Musculoskeletal:   Negative for focal pain or swelling Neurological:   Positive right posterior neck pain and headache 10-point ROS otherwise negative.  ____________________________________________   PHYSICAL EXAM:  VITAL SIGNS: ED Triage Vitals  Enc Vitals Group     BP 01/04/16 1251 (!) 159/129     Pulse Rate 01/04/16 1251 80     Resp 01/04/16 1251 18     Temp 01/04/16 1251 98.5 F (  36.9 C)     Temp src --      SpO2 01/04/16 1251 99 %     Weight 01/04/16 1256 118 lb (53.5 kg)     Height 01/04/16 1256 4' 9"  (1.448 m)     Head Circumference --      Peak Flow --      Pain Score 01/04/16 1256 8     Pain Loc --      Pain Edu? --      Excl. in [place]? --     Vital signs reviewed, nursing assessments reviewed.   Constitutional:   Alert and oriented. Well appearing and in no distress. Eyes:   No scleral icterus. No conjunctival pallor. PERRL. EOMI.  No  nystagmus. ENT   Head:   Normocephalic With large area of contusion on bilateral maxillary and right forehead. There is a large firm hematoma overlying the right maxilla. No fluctuance or induration crepitance.. Nontender temporal arteries   Nose:   No congestion/rhinnorhea. No septal hematoma   Mouth/Throat:   MMM, no pharyngeal erythema. No peritonsillar mass.    Neck:   No stridor. No SubQ emphysema. No meningismus. Hematological/Lymphatic/Immunilogical:   No cervical lymphadenopathy. Cardiovascular:   RRR. Symmetric bilateral radial and DP pulses.  No murmurs.  Respiratory:   Normal respiratory effort without tachypnea nor retractions. Breath sounds are clear and equal bilaterally. No wheezes/rales/rhonchi. Gastrointestinal:   Soft and nontender. Non distended. There is no CVA tenderness.  No rebound, rigidity, or guarding. Genitourinary:   deferred Musculoskeletal:   Nontender with normal range of motion in all extremities. No joint effusions.  No lower extremity tenderness.  No edema. Neurologic:   Normal speech and language.  CN 2-10 normal. Motor grossly intact. Ambulatory No gross focal neurologic deficits are appreciated.  Skin:    Skin is warm, dry and intact. Large facial ecchymoses as above, no other findings  ____________________________________________    LABS (pertinent positives/negatives) (all labs ordered are listed, but only abnormal results are displayed) Labs Reviewed  COMPREHENSIVE METABOLIC PANEL - Abnormal; Notable for the following:       Result Value   BUN 30 (*)    Creatinine, Ser 1.03 (*)    Total Protein 8.7 (*)    GFR calc non Af Amer 46 (*)    GFR calc Af Amer 53 (*)    All other components within normal limits  CBC - Abnormal; Notable for the following:    RBC 3.75 (*)    All other components within normal limits  SEDIMENTATION RATE - Abnormal; Notable for the following:    Sed Rate 92 (*)    All other components within normal limits   URINALYSIS COMPLETEWITH MICROSCOPIC ([facility] ONLY) - Abnormal; Notable for the following:    Color, Urine STRAW (*)    APPearance CLEAR (*)    Bacteria, UA RARE (*)    Squamous Epithelial / LPF 0-5 (*)    All other components within normal limits  URINE CULTURE  TROPONIN I   ____________________________________________   EKG  Interpreted by me Sinus rhythm rate of 66, left axis, right bundle-branch block. No acute ischemic changes.  ____________________________________________    RADIOLOGY  CT head unremarkable CT angiogram neck and head unremarkable. Discussed with the radiologist possible finding of a very small flap in the right internal carotid. This would not be causing any symptoms at this time appears to be incidental. Chest x-ray unremarkable ____________________________________________   PROCEDURES Procedures  ____________________________________________   INITIAL  IMPRESSION / ASSESSMENT AND PLAN / ED COURSE  Pertinent labs & imaging results that were available during my care of the patient were reviewed by me and considered in my medical decision making (see chart for details).  Patient presents with weakness resulting in a recurrent fall today. However she fell onto her bed and did not sustain any new injury. Neuro imaging is again unremarkable. ESR is elevated to 92. No temporal artery tenderness but she is having some headache will start her on a low-dose prednisone. Because of her age but also because clinical exam is not consistent with temporal arteritis, will use a low-dose prednisone. Also because of the persistent large hematoma and a prolonged breakdown., She is at elevated risk of developing a soft tissue infection of the right face. Also start her on Keflex as this may also be making her in a state of stress and more weak and prone to fall if she is developing a superinfection. Patient was also given an one-time extra dose of her daily Synthroid due to  likely physiologic need for greater dose during this current phase of illness.  Patient had previously seen Dr. Ginette Pitman in the past, and requests to see him again. I have given contact information for Dr. Andree Moro. Also encouraged him to continue to cardiology follow-up was recommended by hospitalist on discharge from the hospital 10 days ago. I also recommended follow-up with neurology for further evaluation of her symptoms.     Clinical Course  Comment By Time  Workup negative. Elevated sedimentation rate. We will ambulate, plan to discharge with follow-up with cardiology and neurology if stable. Carrie Mew, MD 08/18 1933    ----------------------------------------- 8:45 PM on 01/04/2016 -----------------------------------------  Patient ambulatory, feels well. Care plan discussed with family and patient, all in agreement and comfortable with the plan. Return precautions given. ____________________________________________   FINAL CLINICAL IMPRESSION(S) / ED DIAGNOSES  Final diagnoses:  Generalized weakness  Facial hematoma, subsequent encounter  Acute nonintractable headache, unspecified headache type       Portions of this note were generated with dragon dictation software. Dictation errors may occur despite best attempts at proofreading.    Carrie Mew, MD 01/04/16 2046

## 2016-01-04 NOTE — ED Triage Notes (Signed)
Pt to ED from home with family c/o syncopal episodes.  Family states patient fell 1 week ago causing right sided facial swelling, was seen in ED with CT scan and cleared.  Family states patient has been confused today.  Pt reports shaking at home and "I will pass out and not know for how long".  Pt presents alert and oriented to self, place, and situation, disoriented to time.

## 2016-01-04 NOTE — ED Notes (Signed)
Pt ambulated with 1 assist without difficulty

## 2016-01-05 LAB — URINE CULTURE: Culture: NO GROWTH

## 2016-01-13 ENCOUNTER — Emergency Department: Payer: Medicare Other

## 2016-01-13 ENCOUNTER — Inpatient Hospital Stay (HOSPITAL_COMMUNITY)
Admission: AD | Admit: 2016-01-13 | Discharge: 2016-01-19 | DRG: 242 | Disposition: A | Discharge status: Home Health | Payer: Medicare Other | Source: Other Acute Inpatient Hospital | Attending: Internal Medicine | Admitting: Internal Medicine

## 2016-01-13 ENCOUNTER — Other Ambulatory Visit: Payer: Self-pay

## 2016-01-13 ENCOUNTER — Emergency Department
Admission: EM | Admit: 2016-01-13 | Discharge: 2016-01-13 | Payer: Medicare Other | Attending: Critical Care Medicine | Admitting: Critical Care Medicine

## 2016-01-13 ENCOUNTER — Encounter: Payer: Self-pay | Admitting: Emergency Medicine

## 2016-01-13 ENCOUNTER — Encounter (HOSPITAL_COMMUNITY)
Admission: AD | Disposition: A | Discharge status: Home Health | Payer: Self-pay | Source: Other Acute Inpatient Hospital | Attending: Internal Medicine

## 2016-01-13 DIAGNOSIS — D649 Anemia, unspecified: Secondary | ICD-10-CM | POA: Insufficient documentation

## 2016-01-13 DIAGNOSIS — I700 Atherosclerosis of aorta: Secondary | ICD-10-CM | POA: Diagnosis not present

## 2016-01-13 DIAGNOSIS — Z9889 Other specified postprocedural states: Secondary | ICD-10-CM | POA: Insufficient documentation

## 2016-01-13 DIAGNOSIS — R55 Syncope and collapse: Secondary | ICD-10-CM | POA: Diagnosis present

## 2016-01-13 DIAGNOSIS — J984 Other disorders of lung: Secondary | ICD-10-CM | POA: Insufficient documentation

## 2016-01-13 DIAGNOSIS — I4891 Unspecified atrial fibrillation: Secondary | ICD-10-CM | POA: Diagnosis not present

## 2016-01-13 DIAGNOSIS — W19XXXA Unspecified fall, initial encounter: Secondary | ICD-10-CM | POA: Diagnosis not present

## 2016-01-13 DIAGNOSIS — I10 Essential (primary) hypertension: Secondary | ICD-10-CM | POA: Diagnosis present

## 2016-01-13 DIAGNOSIS — I480 Paroxysmal atrial fibrillation: Secondary | ICD-10-CM | POA: Diagnosis present

## 2016-01-13 DIAGNOSIS — E039 Hypothyroidism, unspecified: Secondary | ICD-10-CM | POA: Diagnosis present

## 2016-01-13 DIAGNOSIS — E871 Hypo-osmolality and hyponatremia: Secondary | ICD-10-CM | POA: Diagnosis present

## 2016-01-13 DIAGNOSIS — D509 Iron deficiency anemia, unspecified: Secondary | ICD-10-CM | POA: Diagnosis not present

## 2016-01-13 DIAGNOSIS — E785 Hyperlipidemia, unspecified: Secondary | ICD-10-CM | POA: Insufficient documentation

## 2016-01-13 DIAGNOSIS — I495 Sick sinus syndrome: Secondary | ICD-10-CM | POA: Diagnosis not present

## 2016-01-13 DIAGNOSIS — E1165 Type 2 diabetes mellitus with hyperglycemia: Secondary | ICD-10-CM | POA: Diagnosis present

## 2016-01-13 DIAGNOSIS — Z79899 Other long term (current) drug therapy: Secondary | ICD-10-CM | POA: Diagnosis not present

## 2016-01-13 DIAGNOSIS — R001 Bradycardia, unspecified: Secondary | ICD-10-CM | POA: Diagnosis present

## 2016-01-13 DIAGNOSIS — I442 Atrioventricular block, complete: Secondary | ICD-10-CM | POA: Diagnosis present

## 2016-01-13 DIAGNOSIS — K5900 Constipation, unspecified: Secondary | ICD-10-CM | POA: Diagnosis not present

## 2016-01-13 DIAGNOSIS — R296 Repeated falls: Secondary | ICD-10-CM | POA: Diagnosis present

## 2016-01-13 DIAGNOSIS — R471 Dysarthria and anarthria: Secondary | ICD-10-CM | POA: Diagnosis not present

## 2016-01-13 DIAGNOSIS — S0083XA Contusion of other part of head, initial encounter: Secondary | ICD-10-CM | POA: Diagnosis present

## 2016-01-13 DIAGNOSIS — E860 Dehydration: Secondary | ICD-10-CM | POA: Insufficient documentation

## 2016-01-13 DIAGNOSIS — R1110 Vomiting, unspecified: Secondary | ICD-10-CM | POA: Insufficient documentation

## 2016-01-13 DIAGNOSIS — I459 Conduction disorder, unspecified: Secondary | ICD-10-CM

## 2016-01-13 DIAGNOSIS — R569 Unspecified convulsions: Secondary | ICD-10-CM | POA: Diagnosis present

## 2016-01-13 DIAGNOSIS — E079 Disorder of thyroid, unspecified: Secondary | ICD-10-CM | POA: Insufficient documentation

## 2016-01-13 DIAGNOSIS — Y92009 Unspecified place in unspecified non-institutional (private) residence as the place of occurrence of the external cause: Secondary | ICD-10-CM

## 2016-01-13 DIAGNOSIS — Z792 Long term (current) use of antibiotics: Secondary | ICD-10-CM

## 2016-01-13 DIAGNOSIS — Z7952 Long term (current) use of systemic steroids: Secondary | ICD-10-CM | POA: Diagnosis not present

## 2016-01-13 DIAGNOSIS — N289 Disorder of kidney and ureter, unspecified: Secondary | ICD-10-CM | POA: Insufficient documentation

## 2016-01-13 DIAGNOSIS — I469 Cardiac arrest, cause unspecified: Secondary | ICD-10-CM | POA: Diagnosis not present

## 2016-01-13 DIAGNOSIS — N390 Urinary tract infection, site not specified: Secondary | ICD-10-CM | POA: Diagnosis not present

## 2016-01-13 DIAGNOSIS — D72829 Elevated white blood cell count, unspecified: Secondary | ICD-10-CM | POA: Diagnosis present

## 2016-01-13 DIAGNOSIS — R112 Nausea with vomiting, unspecified: Secondary | ICD-10-CM

## 2016-01-13 DIAGNOSIS — N179 Acute kidney failure, unspecified: Secondary | ICD-10-CM | POA: Diagnosis present

## 2016-01-13 DIAGNOSIS — Z95818 Presence of other cardiac implants and grafts: Secondary | ICD-10-CM

## 2016-01-13 DIAGNOSIS — R42 Dizziness and giddiness: Secondary | ICD-10-CM

## 2016-01-13 DIAGNOSIS — I959 Hypotension, unspecified: Secondary | ICD-10-CM | POA: Diagnosis not present

## 2016-01-13 HISTORY — PX: CARDIAC CATHETERIZATION: SHX172

## 2016-01-13 HISTORY — DX: Unspecified convulsions: R56.9

## 2016-01-13 LAB — COMPREHENSIVE METABOLIC PANEL
ALT: 146 U/L — AB (ref 14–54)
AST: 141 U/L — ABNORMAL HIGH (ref 15–41)
Albumin: 3.3 g/dL — ABNORMAL LOW (ref 3.5–5.0)
Alkaline Phosphatase: 49 U/L (ref 38–126)
Anion gap: 11 (ref 5–15)
BUN: 53 mg/dL — ABNORMAL HIGH (ref 6–20)
CALCIUM: 8.4 mg/dL — AB (ref 8.9–10.3)
CHLORIDE: 98 mmol/L — AB (ref 101–111)
CO2: 21 mmol/L — ABNORMAL LOW (ref 22–32)
CREATININE: 1.26 mg/dL — AB (ref 0.44–1.00)
GFR, EST AFRICAN AMERICAN: 42 mL/min — AB (ref >60)
GFR, EST NON AFRICAN AMERICAN: 36 mL/min — AB (ref >60)
Glucose, Bld: 257 mg/dL — ABNORMAL HIGH (ref 65–99)
Potassium: 4.4 mmol/L (ref 3.5–5.1)
Sodium: 130 mmol/L — ABNORMAL LOW (ref 135–145)
Total Bilirubin: 0.6 mg/dL (ref 0.3–1.2)
Total Protein: 6.7 g/dL (ref 6.5–8.1)

## 2016-01-13 LAB — CBC WITH DIFFERENTIAL/PLATELET
BASOS ABS: 0 10*3/uL (ref 0–0.1)
BASOS PCT: 0 %
EOS ABS: 0 10*3/uL (ref 0–0.7)
Eosinophils Relative: 0 %
HEMATOCRIT: 36.1 % (ref 35.0–47.0)
HEMOGLOBIN: 12.2 g/dL (ref 12.0–16.0)
Lymphocytes Relative: 12 %
Lymphs Abs: 2.2 10*3/uL (ref 1.0–3.6)
MCH: 32.5 pg (ref 26.0–34.0)
MCHC: 33.8 g/dL (ref 32.0–36.0)
MCV: 96.1 fL (ref 80.0–100.0)
MONOS PCT: 7 %
Monocytes Absolute: 1.4 10*3/uL — ABNORMAL HIGH (ref 0.2–0.9)
NEUTROS ABS: 15.2 10*3/uL — AB (ref 1.4–6.5)
NEUTROS PCT: 81 %
Platelets: 293 10*3/uL (ref 150–440)
RBC: 3.75 MIL/uL — AB (ref 3.80–5.20)
RDW: 14.7 % — ABNORMAL HIGH (ref 11.5–14.5)
WBC: 18.9 10*3/uL — AB (ref 3.6–11.0)

## 2016-01-13 LAB — URINALYSIS COMPLETE WITH MICROSCOPIC (ARMC ONLY)
Bacteria, UA: NONE SEEN
Bilirubin Urine: NEGATIVE
Glucose, UA: >500 mg/dL — AB
HGB URINE DIPSTICK: NEGATIVE
KETONES UR: NEGATIVE mg/dL
Leukocytes, UA: NEGATIVE
NITRITE: NEGATIVE
PROTEIN: NEGATIVE mg/dL
SPECIFIC GRAVITY, URINE: 1.01 (ref 1.005–1.030)
Squamous Epithelial / LPF: NONE SEEN
pH: 5 (ref 5.0–8.0)

## 2016-01-13 LAB — MRSA PCR SCREENING: MRSA BY PCR: NEGATIVE

## 2016-01-13 LAB — MAGNESIUM: Magnesium: 1.8 mg/dL (ref 1.7–2.4)

## 2016-01-13 LAB — TROPONIN I: TROPONIN I: 0.03 ng/mL — AB (ref <0.03)

## 2016-01-13 SURGERY — TEMPORARY PACEMAKER
Anesthesia: LOCAL

## 2016-01-13 MED ORDER — SODIUM CHLORIDE 0.9 % IV SOLN
INTRAVENOUS | Status: DC
  Administered 2016-01-14: 07:00:00 via INTRAVENOUS

## 2016-01-13 MED ORDER — HEPARIN SODIUM (PORCINE) 5000 UNIT/ML IJ SOLN
5000.0000 [IU] | Freq: Three times a day (TID) | INTRAMUSCULAR | Status: DC
  Administered 2016-01-14 – 2016-01-17 (×8): 5000 [IU] via SUBCUTANEOUS
  Filled 2016-01-13 (×9): qty 1

## 2016-01-13 MED ORDER — DOPAMINE-DEXTROSE 3.2-5 MG/ML-% IV SOLN
0.0000 ug/kg/min | INTRAVENOUS | Status: DC
  Administered 2016-01-13: 2 ug/kg/min via INTRAVENOUS

## 2016-01-13 MED ORDER — ATROPINE SULFATE 1 MG/10ML IJ SOSY
0.2500 mg | PREFILLED_SYRINGE | Freq: Once | INTRAMUSCULAR | Status: DC
  Filled 2016-01-13: qty 10

## 2016-01-13 MED ORDER — ATROPINE SULFATE 1 MG/10ML IJ SOSY
PREFILLED_SYRINGE | INTRAMUSCULAR | Status: AC
  Administered 2016-01-13: 1 mg via INTRAVENOUS
  Filled 2016-01-13: qty 10

## 2016-01-13 MED ORDER — SODIUM CHLORIDE 0.9 % IV SOLN
1000.0000 mg | Freq: Once | INTRAVENOUS | Status: AC
  Administered 2016-01-13: 1000 mg via INTRAVENOUS
  Filled 2016-01-13: qty 10

## 2016-01-13 MED ORDER — ATROPINE SULFATE 1 MG/10ML IJ SOSY
0.2500 mg | PREFILLED_SYRINGE | Freq: Once | INTRAMUSCULAR | Status: AC
  Administered 2016-01-13: 0.25 mg via INTRAVENOUS

## 2016-01-13 MED ORDER — SODIUM CHLORIDE 0.9 % IV SOLN
INTRAVENOUS | Status: DC
  Administered 2016-01-13: 20:00:00 via INTRAVENOUS

## 2016-01-13 MED ORDER — ATROPINE SULFATE 1 MG/10ML IJ SOSY
1.0000 mg | PREFILLED_SYRINGE | Freq: Once | INTRAMUSCULAR | Status: AC
  Administered 2016-01-13: 1 mg via INTRAVENOUS

## 2016-01-13 MED ORDER — PANTOPRAZOLE SODIUM 40 MG PO TBEC
40.0000 mg | DELAYED_RELEASE_TABLET | Freq: Every day | ORAL | Status: DC
  Administered 2016-01-14 – 2016-01-18 (×4): 40 mg via ORAL
  Filled 2016-01-13 (×5): qty 1

## 2016-01-13 MED ORDER — ONDANSETRON HCL 4 MG/2ML IJ SOLN
INTRAMUSCULAR | Status: AC
  Filled 2016-01-13: qty 2

## 2016-01-13 MED ORDER — LORAZEPAM 2 MG/ML IJ SOLN
1.0000 mg | Freq: Once | INTRAMUSCULAR | Status: AC
  Administered 2016-01-13: 1 mg via INTRAVENOUS

## 2016-01-13 MED ORDER — SODIUM CHLORIDE 0.9 % IV SOLN: 250.0000 mL | INTRAVENOUS | Status: DC | PRN

## 2016-01-13 MED ORDER — LEVOTHYROXINE SODIUM 50 MCG PO TABS
50.0000 ug | ORAL_TABLET | Freq: Every day | ORAL | Status: DC
  Administered 2016-01-14 – 2016-01-19 (×6): 50 ug via ORAL
  Filled 2016-01-13 (×6): qty 1

## 2016-01-13 MED ORDER — LORAZEPAM 2 MG/ML IJ SOLN
INTRAMUSCULAR | Status: AC
  Filled 2016-01-13: qty 1

## 2016-01-13 MED ORDER — ONDANSETRON HCL 4 MG/2ML IJ SOLN
4.0000 mg | Freq: Once | INTRAMUSCULAR | Status: AC
  Administered 2016-01-13: 4 mg via INTRAVENOUS

## 2016-01-13 MED ORDER — ATROPINE SULFATE 1 MG/10ML IJ SOSY
0.5000 mg | PREFILLED_SYRINGE | Freq: Once | INTRAMUSCULAR | Status: AC
  Administered 2016-01-13: 0.5 mg via INTRAVENOUS

## 2016-01-13 MED ORDER — ORAL CARE MOUTH RINSE
15.0000 mL | Freq: Two times a day (BID) | OROMUCOSAL | Status: DC
  Administered 2016-01-14 – 2016-01-19 (×6): 15 mL via OROMUCOSAL

## 2016-01-13 MED ORDER — DOPAMINE-DEXTROSE 3.2-5 MG/ML-% IV SOLN
INTRAVENOUS | Status: AC
  Filled 2016-01-13: qty 250

## 2016-01-13 MED ORDER — SODIUM CHLORIDE 0.9 % IV SOLN
1000.0000 mL | Freq: Once | INTRAVENOUS | Status: AC
  Administered 2016-01-13: 1000 mL via INTRAVENOUS

## 2016-01-13 SURGICAL SUPPLY — 5 items
CABLE ADAPT CONN TEMP 6FT (ADAPTER) ×2 IMPLANT
CATH S G BIP PACING (SET/KITS/TRAYS/PACK) ×2 IMPLANT
PACK CARDIAC CATHETERIZATION (CUSTOM PROCEDURE TRAY) ×2 IMPLANT
SHEATH PINNACLE 6F 10CM (SHEATH) ×2 IMPLANT
SLEEVE REPOSITIONING LENGTH 30 (MISCELLANEOUS) ×2 IMPLANT

## 2016-01-13 NOTE — Progress Notes (Signed)
Patient has Zoll Pads on and the Zoll has been [facility].

## 2016-01-13 NOTE — ED Provider Notes (Signed)
[facility] Emergency Department Provider Note     L5 caveat: Review of systems and history is limited by altered mental status.   Time seen: ----------------------------------------- 3:36 PM on 01/13/2016 -----------------------------------------    I have reviewed the triage vital signs and the nursing notes.   HISTORY  Chief Complaint No chief complaint on file.    HPI Diana Braun is a 82 y.o. female who reports presents to the ER for possible seizure-like events, passing out and vomiting.Patient was brought to the ER by family for continued passing out episodes and falls. She is recently been seen and hospitalized for same. Patient denies any pain currently, presents with large facial hematomas and actively vomiting.   Past Medical History:  Diagnosis Date  . Anemia   . Diabetes mellitus without complication ([facility])   . Hyperlipidemia   . Hypertension   . Thyroid disease     Patient Active Problem List   Diagnosis Date Noted  . Dizziness 12/24/2015  . Fall 12/24/2015  . Facial hematoma 12/24/2015  . Acute renal insufficiency 12/24/2015  . Hyperglycemia 12/24/2015  . Hypomagnesemia 12/24/2015  . Leukocytosis 12/24/2015  . Anemia 12/24/2015  . UTI (lower urinary tract infection) 12/24/2015  . A-fib ([facility]) 12/24/2015    Past Surgical History:  Procedure Laterality Date  . HIP SURGERY Right   . KNEE SURGERY Right     Allergies Review of patient's allergies indicates no known allergies.  Social History Social History  Substance Use Topics  . Smoking status: Never Smoker  . Smokeless tobacco: Never Used  . Alcohol use No    Review of Systems Gastrointestinal: Positive for vomiting Neurological: Positive for weakness Review of systems otherwise unknown ____________________________________________   PHYSICAL EXAM:  VITAL SIGNS: ED Triage Vitals  Enc Vitals Group     BP      Pulse      Resp      Temp      Temp src       SpO2      Weight      Height      Head Circumference      Peak Flow      Pain Score      Pain Loc      Pain Edu?      Excl. in [place]?     Constitutional: Alert, Mild to moderate distress. Patient actively vomiting Eyes: Conjunctivae are normal. PERRL. Normal extraocular movements. ENT   Head: Large facial contusions and ecchymosis in various stage of healing.   Nose: No congestion/rhinnorhea.   Mouth/Throat: Mucous membranes are moist.   Neck: No stridor. Cardiovascular: Normal rate, regular rhythm. No murmurs, rubs, or gallops. Respiratory: Normal respiratory effort without tachypnea nor retractions. Breath sounds are clear and equal bilaterally. No wheezes/rales/rhonchi. Gastrointestinal: Soft and nontender. Normal bowel sounds Musculoskeletal: Nontender with normal range of motion in all extremities. No lower extremity tenderness nor edema. Neurologic:  Normal speech and language. No gross focal neurologic deficits are appreciated. Generalized weakness, nothing focal Skin:  Multiple facial contusions are noted ____________________________________________  EKG: Interpreted by me. A. fib with a rate of 62 bpm, PVC, right bundle branch block, LVH  Repeat EKG: Interpreted by me, atrial fibrillation, long sinus pause or asystole. Left axis deviation  Repeat EKG: Interpreted by me, reveals sinus bradycardia, multiple premature complexes, right bundle branch block, LVH ____________________________________________  ED COURSE:  Pertinent labs & imaging results that were available during my care of the patient were reviewed  by me and considered in my medical decision making (see chart for details). Clinical Course  Patient actively seizing on arrival, has an episode where she is shaking and poorly responsive and then starts profusely vomiting. The event lasted very briefly. This is generalized tonic-clonic like shaking  episode.  Procedures ____________________________________________   LABS (pertinent positives/negatives)  Labs Reviewed  CBC WITH DIFFERENTIAL/PLATELET - Abnormal; Notable for the following:       Result Value   WBC 18.9 (*)    RBC 3.75 (*)    RDW 14.7 (*)    Neutro Abs 15.2 (*)    Monocytes Absolute 1.4 (*)    All other components within normal limits  TROPONIN I - Abnormal; Notable for the following:    Troponin I 0.03 (*)    All other components within normal limits  COMPREHENSIVE METABOLIC PANEL - Abnormal; Notable for the following:    Sodium 130 (*)    Chloride 98 (*)    CO2 21 (*)    Glucose, Bld 257 (*)    BUN 53 (*)    Creatinine, Ser 1.26 (*)    Calcium 8.4 (*)    Albumin 3.3 (*)    AST 141 (*)    ALT 146 (*)    GFR calc non Af Amer 36 (*)    GFR calc Af Amer 42 (*)    All other components within normal limits  MAGNESIUM  URINALYSIS COMPLETEWITH MICROSCOPIC ([facility] ONLY)   CRITICAL CARE Performed by: Emily Filbert   Total critical care time: 30 minutes  Critical care time was exclusive of separately billable procedures and treating other patients.  Critical care was necessary to treat or prevent imminent or life-threatening deterioration.  Critical care was time spent personally by me on the following activities: development of treatment plan with patient and/or surrogate as well as nursing, discussions with consultants, evaluation of patient's response to treatment, examination of patient, obtaining history from patient or surrogate, ordering and performing treatments and interventions, ordering and review of laboratory studies, ordering and review of radiographic studies, pulse oximetry and re-evaluation of patient's condition.   RADIOLOGY Images were viewed by me  CT head IMPRESSION: 1. Chronic lung disease without superimposed acute cardiopulmonary abnormality. 2. Aortic atherosclerosis. IMPRESSION: 1. No acute intracranial  abnormalities. 2. Chronic microvascular disease  ____________________________________________  FINAL ASSESSMENT AND PLAN  Syncope, vomiting, Bradycardia, likely sick sinus syndrome  Plan: Patient with labs and imaging as dictated above. Patient presents to the ER after possible seizure-like events. These appear to be syncopal events from arrhythmia. Patient would have long sinus pauses or was asystolic causing her to have seizure-like activity. In the ER she has been tachycardic, has had atrial fibrillation, has been an escape rhythm, has been in sinus bradycardia. She has required a dose of atropine for rate control. Currently blood pressure is stable, she will likely need pacemaker placement. She does appear dehydrated, has received IV fluids. I will discuss with the cardiology team at [facility] health.   Emily Filbert, MD   Note: This dictation was prepared with Dragon dictation. Any transcriptional errors that result from this process are unintentional    Emily Filbert, MD 01/13/16 [phone]

## 2016-01-13 NOTE — Progress Notes (Signed)
Patient went asystole on the monitor. When RN walked into room, patient was shaking but responsive. Dr. Delford FieldWright ([place]) called and notified. No new orders given at this time. Will continue to monitor patient.

## 2016-01-13 NOTE — ED Triage Notes (Signed)
Pt arrived to Triage after having seizure. Pt has hx of seizures. Pt has large hematoma on her right cheek from previous seizure and fall. Pt has had multiple seizures since being roomed in ED.

## 2016-01-13 NOTE — H&P (Signed)
PULMONARY / CRITICAL CARE MEDICINE   Name: Diana Braun MRN: [id number] DOB: [birth year]    ADMISSION DATE:  01/13/2016 CONSULTATION DATE:  01/13/2016  REFERRING MD:  [facility] EDP Dr. Mayford Knife  CHIEF COMPLAINT:  Syncope  HISTORY OF PRESENT ILLNESS:   82 year old female with PMH as below, which is significant for DM (now off meds), HTN, hypothyroidism, and HLD. She is [place] and has only been in the [place] for a few months. She has already been hospitalized once 8/7-8/9. Chief complaint at that time was dizziness and syncope. She was found to have UTI secondary to GBS and was treated with amoxicillin. Symptoms of orthostasis, dizziness, and syncope were attributed to UTI.   She was discharged to home with home health, however, she presented to ED again 8/18 with complaint of syncope and shaking during LOC. Workup was negative and she was discharged home with cardiology and neurology follow up.  8/27 she again presented to ED at [facility] for syncope with "seizure-like" activity and nausea. Upon arrival to the ED she was actively seizing and vomiting. Very brief duration.  Treated with Keppra. On telemetry seizures would coincide with long pauses. Patient had several different arrythmias in ED including ST, AF, "escape rhythm" and CHB. She was transferred to [facility] for cardiology evaluation and pacing.   PAST MEDICAL HISTORY :  She  has a past medical history of Anemia; Diabetes mellitus without complication (HCC); Hyperlipidemia; Hypertension; Seizures (HCC); and Thyroid disease.  PAST SURGICAL HISTORY: She  has a past surgical history that includes Knee surgery (Right) and Hip surgery (Right).  No Known Allergies  No current facility-administered medications on file prior to encounter.    Current Outpatient Prescriptions on File Prior to Encounter  Medication Sig  . cephALEXin (KEFLEX) 500 MG capsule Take 1 capsule (500 mg total) by mouth 3 (three) times daily. (Patient taking  differently: Take 500 mg by mouth 3 (three) times daily. 7 day course filled 01/04/16)  . levothyroxine (SYNTHROID, LEVOTHROID) 50 MCG tablet Take 50 mcg by mouth daily.  . metoprolol tartrate (LOPRESSOR) 25 MG tablet Take 1 tablet (25 mg total) by mouth 2 (two) times daily.  . predniSONE (DELTASONE) 20 MG tablet Take 1 tablet (20 mg total) by mouth daily. (Patient taking differently: Take 20 mg by mouth daily. #10 filled 01/04/16)    FAMILY HISTORY:  Her has no family status information on file.    SOCIAL HISTORY: She  reports that she has never smoked. She has never used smokeless tobacco. She reports that she does not drink alcohol or use drugs.  REVIEW OF SYSTEMS:   Bolds are positive  Constitutional: weight loss, gain, night sweats, Fevers, chills, fatigue .  HEENT: headaches, Sore throat, sneezing, nasal congestion, post nasal drip, Difficulty swallowing, Tooth/dental problems, visual complaints visual changes, ear ache CV:  chest pain, radiates:throat ,Orthopnea, PND, swelling in lower extremities, dizziness, palpitations, syncope.  GI  heartburn, indigestion, abdominal pain, nausea, vomiting, diarrhea, change in bowel habits, loss of appetite, bloody stools.  Resp: cough, productive: , hemoptysis, dyspnea, chest pain, pleuritic.  Skin: rash or itching or icterus GU: dysuria, change in color of urine, urgency or frequency. flank pain, hematuria  MS: joint pain or swelling. decreased range of motion  Psych: change in mood or affect. depression or anxiety.  Neuro: difficulty with speech, weakness, numbness, ataxia    SUBJECTIVE:    VITAL SIGNS: BP (!) 135/49   Pulse 76   Temp 97.8 F (  36.6 C) (Oral)   Resp (!) 23   Ht 4\' 11"  (1.499 m)   Wt 54.1 kg (119 lb 4.3 oz)   SpO2 100%   BMI 24.09 kg/m   HEMODYNAMICS:    VENTILATOR SETTINGS: FiO2 (%):  [0 %] 0 %  INTAKE / OUTPUT: No intake/output data recorded.  PHYSICAL EXAMINATION: General:  Elderly female in NAD Neuro:   Difficult to discern, daughter states she is not confused, however, she is not really answering her daughter much or responding to my gestures on exam.  HEENT:  Ecchymosis to both sides of face. Cardiovascular:  Currently NST, no MRG Lungs:  clear Abdomen:  Soft, non-tender, non-distended Musculoskeletal:  No acute deformity Skin:  Grossly intact  LABS:  BMET  Recent Labs Lab 01/13/16 1700  NA 130*  K 4.4  CL 98*  CO2 21*  BUN 53*  CREATININE 1.26*  GLUCOSE 257*    Electrolytes  Recent Labs Lab 01/13/16 1549 01/13/16 1700  CALCIUM  --  8.4*  MG 1.8  --     CBC  Recent Labs Lab 01/13/16 1549  WBC 18.9*  HGB 12.2  HCT 36.1  PLT 293    Coag's No results for input(s): APTT, INR in the last 168 hours.  Sepsis Markers No results for input(s): LATICACIDVEN, PROCALCITON, O2SATVEN in the last 168 hours.  ABG No results for input(s): PHART, PCO2ART, PO2ART in the last 168 hours.  Liver Enzymes  Recent Labs Lab 01/13/16 1700  AST 141*  ALT 146*  ALKPHOS 49  BILITOT 0.6  ALBUMIN 3.3*    Cardiac Enzymes  Recent Labs Lab 01/13/16 1549  TROPONINI 0.03*    Glucose No results for input(s): GLUCAP in the last 168 hours.  Imaging Dg Chest 1 View  Result Date: 01/13/2016 CLINICAL DATA:  Heart rate elevated EXAM: CHEST 1 VIEW COMPARISON:  01/04/2016 FINDINGS: Aortic atherosclerosis. Heart size is normal. Chronic interstitial coarsening is noted bilaterally No pleural effusion or edema. No airspace consolidation. Chronic left lateral rib fracture deformities noted. IMPRESSION: 1. Chronic lung disease without superimposed acute cardiopulmonary abnormality. 2. Aortic atherosclerosis. Electronically Signed   By: Signa Kell M.D.   On: 01/13/2016 16:15   Ct Head Wo Contrast  Result Date: 01/13/2016 CLINICAL DATA:  Seizures. EXAM: CT HEAD WITHOUT CONTRAST TECHNIQUE: Contiguous axial images were obtained from the base of the skull through the vertex without  intravenous contrast. COMPARISON:  01/04/2016 FINDINGS: Brain: There is low attenuation within the subcortical and periventricular white matter compatible with chronic microvascular disease. 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 There is no abnormal extra-axial fluid collection, intracranial hemorrhage or mass. No evidence for acute brain infarct. Vascular: No hyperdense vessel or unexpected calcification. Skull: The osseous skull is intact. Sinuses/Orbits: Mucosal thickening involving the left maxillary sinus. The remaining paranasal sinuses and the mastoid air cells are clear. Other: Gas is identified within the soft tissues overlying the right side of face. IMPRESSION: 1. No acute intracranial abnormalities. 2. Chronic microvascular disease Electronically Signed   By: Signa Kell M.D.   On: 01/13/2016 16:59     STUDIES:  CT head 8/27 > no acute abnormalities  CULTURES: BCx2 8/27 >  ANTIBIOTICS:   SIGNIFICANT EVENTS: 8/7-8/9 > admit to [facility] for syncope, diagnosed with UTI   LINES/TUBES:   DISCUSSION: 82 year old female with syncope and seizures. Found to be bradycardic in ED and transferred to [facility] for further eval. Cardiology placed temp pacer 8/27 PM.   ASSESSMENT /  PLAN:  PULMONARY A: No acute issues  P:   Supplemental O2 if needed to keep sats> 92%  CARDIOVASCULAR A:  Atrial fibrillation Bradycardia  P:  Cardiology to see, likely will place temp pacer tonight Dopamine infusion Hold home antihypertensives Trend troponin  RENAL A:   AKI Hyponatremia  P:   Maintenance NS @ 75/hr Follow BMP  GASTROINTESTINAL A:   No acute issues  P:   NPO Pepcid  HEMATOLOGIC A:   No acute issues  P:  Follow CBC SQ heparin for VTE ppx  INFECTIOUS A:   Leukocytosis, no clear source or concern for infection besides WBC  P:   Defer ABX for now Culture blood  ENDOCRINE A:   Hyperglycemia with history of DM Hypothyroidism  P:    TSH CBG monitoring and SSI  NEUROLOGIC A:   Seizures   P:   Continue scheduled Keppra Neurology consult in AM.   FAMILY  - Updates: Family updated at bedside  - Inter-disciplinary family meet or Palliative Care meeting due by:9/3  Joneen Roachaul Chelisa Hennen, AGACNP-BC [facility] Pulmonology/Critical Care Pager [phone] or [phone]  01/13/2016 10:56 PM

## 2016-01-13 NOTE — Progress Notes (Signed)
eLink Physician-Brief Progress Note Patient Name: Diana Braun Driggs DOB: [birth year] MRN: [id number]   Date of Service  01/13/2016  HPI/Events of Note  [facility] block [facility]  eICU Interventions  See full note to follow      Intervention Category Evaluation Type: New Patient Evaluation  [name] 01/13/2016, 8:00 PM

## 2016-01-13 NOTE — Interval H&P Note (Signed)
History and Physical Interval Note:  01/13/2016 11:15 PM  Diana Braun  has presented today for surgery, with the diagnosis of Temp Pacer  The various methods of treatment have been discussed with the patient and family. After consideration of risks, benefits and other options for treatment, the patient has consented to  Procedure(s): Temporary Pacemaker (N/A) as a surgical intervention .  The patient's history has been reviewed, patient examined, no change in status, stable for surgery.  I have reviewed the patient's chart and labs.  Questions were answered to the patient's satisfaction.     Theron AristaPeter [place]

## 2016-01-13 NOTE — Progress Notes (Addendum)
eLink Physician-Brief Progress Note Patient Name: Diana Braun DOB: 04/14/1923 MRN: [id number]   Date of Service  01/13/2016  HPI/Events of Note  Pt with continued pauses and asystole  eICU Interventions  Have asked for formal cards consult tonight for poss temp pacemaker  Full H and P to follow      Intervention Category Major Interventions: Arrhythmia - evaluation and management  Shan Levansatrick Wright 01/13/2016, 9:44 PM

## 2016-01-13 NOTE — Consult Note (Signed)
Referring Physician: Dr. Delford Field Primary Physician: Primary Cardiologist: Reason for Consultation: " symptomatic bradycardia, pauses, need for TVP"   HPI: She does not speak English and history from chart and consulting MD 82 y/o woman from [place] with pmh of DM, HTn, hLP, hypothyroidism , recently admitted in hospital and treated for UTI and was diagnosed with PAF. She presented to outside hospital with c/o dizziness and syncopal episodes. In ER she was found to be in atrial fibrillation and ekg showed multiple pauses without escape rhythm with syncopal episodes. She also has leukocytosis. Also having multiple falls.  Per chart  "She has already been hospitalized once 8/7-8/9. Chief complaint at that time was dizziness and syncope. She was found to have UTI secondary to GBS and was treated with amoxicillin. Symptoms of orthostasis, dizziness, and syncope were attributed to UTI.   She was discharged to home with home health, however, she presented to ED again 8/18 with complaint of syncope and shaking during LOC. Workup was negative and she was discharged home with cardiology and neurology follow up.  8/27 she again presented to ED at [facility] for syncope with "seizure-like" activity and nausea. Upon arrival to the ED she was actively seizing and vomiting. Very brief duration.  Treated with Keppra. On telemetry seizures would coincide with long pauses. Patient had several different arrythmias in ED including ST, AF, "escape rhythm" and CHB. She was transferred to [facility] for cardiology evaluation and pacing. "  In CCU initially she was asymptomatic but then developed multiple symptomatic sinus pauses.   Review of Systems:     Cardiac Review of Systems: {Y] = yes [ ]  = no  Chest Pain [    ]  Resting SOB [   ] Exertional SOB  [  ]  Orthopnea [  ]   Pedal Edema [   ]    Palpitations [  ] Syncope  [ y ]   Presyncope [   ]  General Review of Systems: [Y] = yes [  ]=no Constitional: recent  weight change [  ]; anorexia [  ]; fatigue [  ]; nausea [  ]; night sweats [  ]; fever [  ]; or chills [  ];                                                                     Eyes : blurred vision [  ]; diplopia [   ]; vision changes [  ];  Amaurosis fugax[  ]; Resp: cough [  ];  wheezing[  ];  hemoptysis[  ];  PND [  ];  GI:  gallstones[  ], vomiting[  ];  dysphagia[  ]; melena[  ];  hematochezia [  ]; heartburn[  ];   GU: kidney stones [  ]; hematuria[  ];   dysuria [  ];  nocturia[  ]; incontinence [  ];             Skin: rash, swelling[  ];, hair loss[  ];  peripheral edema[  ];  or itching[  ]; Musculosketetal: myalgias[  ];  joint swelling[  ];  joint erythema[  ];  joint pain[  ];  back pain[  ];  Heme/Lymph: bruising[  ];  bleeding[  ];  anemia[  ];    Past Medical History:  Diagnosis Date  . Anemia   . Diabetes mellitus without complication (HCC)   . Hyperlipidemia   . Hypertension   . Seizures (HCC)   . Thyroid disease     Medications Prior to Admission  Medication Sig Dispense Refill  . acetaminophen (TYLENOL) 500 MG tablet Take 500 mg by mouth every 6 (six) hours as needed (pain).    . cephALEXin (KEFLEX) 500 MG capsule Take 1 capsule (500 mg total) by mouth 3 (three) times daily. (Patient taking differently: Take 500 mg by mouth 3 (three) times daily. 7 day course filled 01/04/16) 21 capsule 0  . levothyroxine (SYNTHROID, LEVOTHROID) 50 MCG tablet Take 50 mcg by mouth daily.    . metoprolol tartrate (LOPRESSOR) 25 MG tablet Take 1 tablet (25 mg total) by mouth 2 (two) times daily. 60 tablet 0  . predniSONE (DELTASONE) 20 MG tablet Take 1 tablet (20 mg total) by mouth daily. (Patient taking differently: Take 20 mg by mouth daily. #10 filled 01/04/16) 10 tablet 0     . [MAR Hold] heparin  5,000 Units Subcutaneous Q8H  . [MAR Hold] levothyroxine  50 mcg Oral QAC breakfast  . [MAR Hold] mouth rinse  15 mL Mouth Rinse BID  . [MAR Hold] pantoprazole  40 mg Oral Daily     Infusions: . sodium chloride 75 mL/hr at 01/13/16 2307  . [MAR Hold] DOPamine 2 mcg/kg/min (01/13/16 2239)    No Known Allergies  Social History   Social History  . Marital status: Widowed    Spouse name: N/A  . Number of children: N/A  . Years of education: N/A   Occupational History  . Not on file.   Social History Main Topics  . Smoking status: Never Smoker  . Smokeless tobacco: Never Used  . Alcohol use No  . Drug use: No  . Sexual activity: Not on file   Other Topics Concern  . Not on file   Social History Narrative  . No narrative on file    No family history on file.  PHYSICAL EXAM: Vitals:   01/13/16 2315 01/13/16 2320  BP:    Pulse: (!) 0 (!) 0  Resp: (!) 0 (!) 0  Temp:       Intake/Output Summary (Last 24 hours) at 01/13/16 2346 Last data filed at 01/13/16 2314  Gross per 24 hour  Intake            168.2 ml  Output              270 ml  Net           -101.8 ml    General:  Well appearing. No respiratory difficulty HEENT: normal Neck: supple. no JVD. Carotids 2+ bilat; no bruits.  Cor: PMI nondisplaced. Regular rate & rhythm. No rubs, gallops or murmurs. Lungs: clear Abdomen: soft, nontender, nondistended. No hepatosplenomegaly.  Extremities: no cyanosis, clubbing, rash, edema Neuro: alert & oriented x 3, cranial nerves grossly intact. moves all 4 extremities w/o difficulty. ECG:  Results for orders placed or performed during the hospital encounter of 01/13/16 (from the past 24 hour(s))  MRSA PCR Screening     Status: None   Collection Time: 01/13/16  8:12 PM  Result Value Ref Range   MRSA by PCR NEGATIVE NEGATIVE   Dg Chest 1 View  Result Date: 01/13/2016 CLINICAL DATA:  Heart rate elevated EXAM: CHEST 1 VIEW COMPARISON:  01/04/2016 FINDINGS: Aortic atherosclerosis. Heart size is normal. Chronic interstitial coarsening is noted bilaterally No pleural effusion or edema. No airspace consolidation. Chronic left lateral rib fracture  deformities noted. IMPRESSION: 1. Chronic lung disease without superimposed acute cardiopulmonary abnormality. 2. Aortic atherosclerosis. Electronically Signed   By: Signa Kell M.D.   On: 01/13/2016 16:15   Ct Head Wo Contrast  Result Date: 01/13/2016 CLINICAL DATA:  Seizures. EXAM: CT HEAD WITHOUT CONTRAST TECHNIQUE: Contiguous axial images were obtained from the base of the skull through the vertex without intravenous contrast. COMPARISON:  01/04/2016 FINDINGS: Brain: There is low attenuation within the subcortical and periventricular white matter compatible with chronic microvascular disease. 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 There is no abnormal extra-axial fluid collection, intracranial hemorrhage or mass. No evidence for acute brain infarct. Vascular: No hyperdense vessel or unexpected calcification. Skull: The osseous skull is intact. Sinuses/Orbits: Mucosal thickening involving the left maxillary sinus. The remaining paranasal sinuses and the mastoid air cells are clear. Other: Gas is identified within the soft tissues overlying the right side of face. IMPRESSION: 1. No acute intracranial abnormalities. 2. Chronic microvascular disease Electronically Signed   By: Signa Kell M.D.   On: 01/13/2016 16:59   EKG: Afib with PVCs  Tele showing complete heart block with no escape rhythm   ASSESSMENT: 1. Atrial fibrillation CHADSVASC2 = 5 with intermittent complete heart block   PLAN/DISCUSSION: 1. Will place TVP tonight 2. EP consultation in am  3. Rule out reversible causes, 4. Will likely need PPM  Diana Braun Alonna Minium

## 2016-01-13 NOTE — Progress Notes (Signed)
Cardiologist came in to see patient. Plan to possibly do a temporary pacemaker tonight. When cards left the room, patient went asystole and clinched her first while hands to her face and was having seizure like activity for 30 seconds. MD updated. Cards to consult with Dr. [place] and come back to see patient.

## 2016-01-14 ENCOUNTER — Inpatient Hospital Stay (HOSPITAL_COMMUNITY): Payer: Medicare Other

## 2016-01-14 ENCOUNTER — Encounter (HOSPITAL_COMMUNITY): Payer: Self-pay | Admitting: Cardiology

## 2016-01-14 DIAGNOSIS — I469 Cardiac arrest, cause unspecified: Secondary | ICD-10-CM

## 2016-01-14 DIAGNOSIS — R569 Unspecified convulsions: Secondary | ICD-10-CM

## 2016-01-14 DIAGNOSIS — I959 Hypotension, unspecified: Secondary | ICD-10-CM

## 2016-01-14 DIAGNOSIS — R471 Dysarthria and anarthria: Secondary | ICD-10-CM

## 2016-01-14 LAB — GLUCOSE, CAPILLARY
GLUCOSE-CAPILLARY: 92 mg/dL (ref 65–99)
GLUCOSE-CAPILLARY: 112 mg/dL — AB (ref 65–99)
Glucose-Capillary: 88 mg/dL (ref 65–99)
Glucose-Capillary: 125 mg/dL — ABNORMAL HIGH (ref 65–99)

## 2016-01-14 LAB — CBC
HCT: 30.3 % — ABNORMAL LOW (ref 36.0–46.0)
HCT: 35.5 % — ABNORMAL LOW (ref 36.0–46.0)
HEMOGLOBIN: 9.9 g/dL — AB (ref 12.0–15.0)
HEMOGLOBIN: 11.6 g/dL — AB (ref 12.0–15.0)
MCH: 31.5 pg (ref 26.0–34.0)
MCH: 31.5 pg (ref 26.0–34.0)
MCHC: 32.7 g/dL (ref 30.0–36.0)
MCHC: 32.7 g/dL (ref 30.0–36.0)
MCV: 96.5 fL (ref 78.0–100.0)
MCV: 96.5 fL (ref 78.0–100.0)
PLATELETS: 234 10*3/uL (ref 150–400)
PLATELETS: 259 10*3/uL (ref 150–400)
RBC: 3.14 MIL/uL — ABNORMAL LOW (ref 3.87–5.11)
RBC: 3.68 MIL/uL — ABNORMAL LOW (ref 3.87–5.11)
RDW: 14.2 % (ref 11.5–15.5)
RDW: 14.5 % (ref 11.5–15.5)
WBC: 11.1 10*3/uL — ABNORMAL HIGH (ref 4.0–10.5)
WBC: 15.9 10*3/uL — ABNORMAL HIGH (ref 4.0–10.5)

## 2016-01-14 LAB — BASIC METABOLIC PANEL
ANION GAP: 7 (ref 5–15)
BUN: 37 mg/dL — ABNORMAL HIGH (ref 6–20)
CALCIUM: 8.3 mg/dL — AB (ref 8.9–10.3)
CO2: 24 mmol/L (ref 22–32)
CREATININE: 1.17 mg/dL — AB (ref 0.44–1.00)
Chloride: 104 mmol/L (ref 101–111)
GFR calc Af Amer: 46 mL/min — ABNORMAL LOW (ref >60)
GFR, EST NON AFRICAN AMERICAN: 39 mL/min — AB (ref >60)
GLUCOSE: 99 mg/dL (ref 65–99)
Potassium: 4.4 mmol/L (ref 3.5–5.1)
Sodium: 135 mmol/L (ref 135–145)

## 2016-01-14 LAB — CORTISOL: Cortisol, Plasma: 7.5 ug/dL

## 2016-01-14 LAB — LIPID PANEL
Cholesterol: 122 mg/dL (ref 0–200)
HDL: 48 mg/dL (ref >40)
LDL Cholesterol: 57 mg/dL (ref 0–99)
TRIGLYCERIDES: 86 mg/dL (ref <150)
Total CHOL/HDL Ratio: 2.5 RATIO
VLDL: 17 mg/dL (ref 0–40)

## 2016-01-14 LAB — COMPREHENSIVE METABOLIC PANEL
ALK PHOS: 48 U/L (ref 38–126)
ALT: 143 U/L — AB (ref 14–54)
ANION GAP: 8 (ref 5–15)
AST: 106 U/L — ABNORMAL HIGH (ref 15–41)
Albumin: 3.4 g/dL — ABNORMAL LOW (ref 3.5–5.0)
BILIRUBIN TOTAL: 1 mg/dL (ref 0.3–1.2)
BUN: 42 mg/dL — ABNORMAL HIGH (ref 6–20)
CALCIUM: 8.7 mg/dL — AB (ref 8.9–10.3)
CO2: 25 mmol/L (ref 22–32)
CREATININE: 1.29 mg/dL — AB (ref 0.44–1.00)
Chloride: 100 mmol/L — ABNORMAL LOW (ref 101–111)
GFR, EST AFRICAN AMERICAN: 41 mL/min — AB (ref >60)
GFR, EST NON AFRICAN AMERICAN: 35 mL/min — AB (ref >60)
Glucose, Bld: 159 mg/dL — ABNORMAL HIGH (ref 65–99)
Potassium: 4.9 mmol/L (ref 3.5–5.1)
Sodium: 133 mmol/L — ABNORMAL LOW (ref 135–145)
TOTAL PROTEIN: 7.3 g/dL (ref 6.5–8.1)

## 2016-01-14 LAB — PHOSPHORUS
PHOSPHORUS: 3.6 mg/dL (ref 2.5–4.6)
PHOSPHORUS: 3.9 mg/dL (ref 2.5–4.6)

## 2016-01-14 LAB — TYPE AND SCREEN
ABO/RH(D): B POS
ANTIBODY SCREEN: NEGATIVE

## 2016-01-14 LAB — PROTIME-INR
INR: 1.14
PROTHROMBIN TIME: 14.6 s (ref 11.4–15.2)

## 2016-01-14 LAB — TROPONIN I
TROPONIN I: 0.04 ng/mL — AB (ref <0.03)
TROPONIN I: 0.05 ng/mL — AB (ref <0.03)
TROPONIN I: 0.04 ng/mL — AB (ref <0.03)

## 2016-01-14 LAB — ABO/RH: ABO/RH(D): B POS

## 2016-01-14 LAB — MAGNESIUM
MAGNESIUM: 1.5 mg/dL — AB (ref 1.7–2.4)
Magnesium: 1.5 mg/dL — ABNORMAL LOW (ref 1.7–2.4)

## 2016-01-14 LAB — TSH: TSH: 0.705 u[IU]/mL (ref 0.350–4.500)

## 2016-01-14 MED ORDER — LIDOCAINE HCL (PF) 1 % IJ SOLN
INTRAMUSCULAR | Status: AC
  Filled 2016-01-14: qty 30

## 2016-01-14 MED ORDER — INSULIN ASPART 100 UNIT/ML ~~LOC~~ SOLN: 0.0000 [IU] | Freq: Every day | SUBCUTANEOUS | Status: DC

## 2016-01-14 MED ORDER — FENTANYL CITRATE (PF) 100 MCG/2ML IJ SOLN
INTRAMUSCULAR | Status: AC
  Filled 2016-01-14: qty 2

## 2016-01-14 MED ORDER — SODIUM CHLORIDE 0.9 % IV SOLN
INTRAVENOUS | Status: DC | PRN
  Administered 2016-01-14: 10 mL/h via INTRAVENOUS

## 2016-01-14 MED ORDER — INSULIN ASPART 100 UNIT/ML ~~LOC~~ SOLN
0.0000 [IU] | Freq: Three times a day (TID) | SUBCUTANEOUS | Status: DC
  Administered 2016-01-14: 1 [IU] via SUBCUTANEOUS
  Administered 2016-01-15: 2 [IU] via SUBCUTANEOUS
  Administered 2016-01-16: 1 [IU] via SUBCUTANEOUS
  Administered 2016-01-16: 2 [IU] via SUBCUTANEOUS
  Administered 2016-01-16 – 2016-01-17 (×3): 1 [IU] via SUBCUTANEOUS
  Administered 2016-01-17 – 2016-01-18 (×2): 2 [IU] via SUBCUTANEOUS
  Administered 2016-01-18: 1 [IU] via SUBCUTANEOUS

## 2016-01-14 MED ORDER — DEXTROSE 5 % IV SOLN
3.0000 g | Freq: Once | INTRAVENOUS | Status: AC
  Administered 2016-01-14: 3 g via INTRAVENOUS
  Filled 2016-01-14: qty 6

## 2016-01-14 MED ORDER — LIDOCAINE HCL (PF) 1 % IJ SOLN
INTRAMUSCULAR | Status: DC | PRN
  Administered 2016-01-14: 20 mL

## 2016-01-14 MED ORDER — ACETAMINOPHEN 325 MG PO TABS: 325.0000 mg | ORAL_TABLET | Freq: Four times a day (QID) | ORAL | Status: DC | PRN

## 2016-01-14 MED ORDER — FENTANYL CITRATE (PF) 100 MCG/2ML IJ SOLN
INTRAMUSCULAR | Status: DC | PRN
  Administered 2016-01-14: 25 ug via INTRAVENOUS

## 2016-01-14 MED ORDER — HEPARIN (PORCINE) IN NACL 2-0.9 UNIT/ML-% IJ SOLN
INTRAMUSCULAR | Status: DC | PRN
  Administered 2016-01-14: 500 mL

## 2016-01-14 NOTE — H&P (View-Only) (Signed)
Referring Physician: Dr. Delford Field Primary Physician: Primary Cardiologist: Reason for Consultation: " symptomatic bradycardia, pauses, need for TVP"   HPI: She does not speak English and history from chart and consulting MD 82 y/o woman from [place] with pmh of DM, HTn, hLP, hypothyroidism , recently admitted in hospital and treated for UTI and was diagnosed with PAF. She presented to outside hospital with c/o dizziness and syncopal episodes. In ER she was found to be in atrial fibrillation and ekg showed multiple pauses without escape rhythm with syncopal episodes. She also has leukocytosis. Also having multiple falls.  Per chart  "She has already been hospitalized once 8/7-8/9. Chief complaint at that time was dizziness and syncope. She was found to have UTI secondary to GBS and was treated with amoxicillin. Symptoms of orthostasis, dizziness, and syncope were attributed to UTI.   She was discharged to home with home health, however, she presented to ED again 8/18 with complaint of syncope and shaking during LOC. Workup was negative and she was discharged home with cardiology and neurology follow up.  8/27 she again presented to ED at [facility] for syncope with "seizure-like" activity and nausea. Upon arrival to the ED she was actively seizing and vomiting. Very brief duration.  Treated with Keppra. On telemetry seizures would coincide with long pauses. Patient had several different arrythmias in ED including ST, AF, "escape rhythm" and CHB. She was transferred to [facility] for cardiology evaluation and pacing. "  In CCU initially she was asymptomatic but then developed multiple symptomatic sinus pauses.   Review of Systems:     Cardiac Review of Systems: {Y] = yes [ ]  = no  Chest Pain [    ]  Resting SOB [   ] Exertional SOB  [  ]  Orthopnea [  ]   Pedal Edema [   ]    Palpitations [  ] Syncope  [ y ]   Presyncope [   ]  General Review of Systems: [Y] = yes [  ]=no Constitional: recent  weight change [  ]; anorexia [  ]; fatigue [  ]; nausea [  ]; night sweats [  ]; fever [  ]; or chills [  ];                                                                     Eyes : blurred vision [  ]; diplopia [   ]; vision changes [  ];  Amaurosis fugax[  ]; Resp: cough [  ];  wheezing[  ];  hemoptysis[  ];  PND [  ];  GI:  gallstones[  ], vomiting[  ];  dysphagia[  ]; melena[  ];  hematochezia [  ]; heartburn[  ];   GU: kidney stones [  ]; hematuria[  ];   dysuria [  ];  nocturia[  ]; incontinence [  ];             Skin: rash, swelling[  ];, hair loss[  ];  peripheral edema[  ];  or itching[  ]; Musculosketetal: myalgias[  ];  joint swelling[  ];  joint erythema[  ];  joint pain[  ];  back pain[  ];  Heme/Lymph: bruising[  ];  bleeding[  ];  anemia[  ];    Past Medical History:  Diagnosis Date  . Anemia   . Diabetes mellitus without complication (HCC)   . Hyperlipidemia   . Hypertension   . Seizures (HCC)   . Thyroid disease     Medications Prior to Admission  Medication Sig Dispense Refill  . acetaminophen (TYLENOL) 500 MG tablet Take 500 mg by mouth every 6 (six) hours as needed (pain).    . cephALEXin (KEFLEX) 500 MG capsule Take 1 capsule (500 mg total) by mouth 3 (three) times daily. (Patient taking differently: Take 500 mg by mouth 3 (three) times daily. 7 day course filled 01/04/16) 21 capsule 0  . levothyroxine (SYNTHROID, LEVOTHROID) 50 MCG tablet Take 50 mcg by mouth daily.    . metoprolol tartrate (LOPRESSOR) 25 MG tablet Take 1 tablet (25 mg total) by mouth 2 (two) times daily. 60 tablet 0  . predniSONE (DELTASONE) 20 MG tablet Take 1 tablet (20 mg total) by mouth daily. (Patient taking differently: Take 20 mg by mouth daily. #10 filled 01/04/16) 10 tablet 0     . [MAR Hold] heparin  5,000 Units Subcutaneous Q8H  . [MAR Hold] levothyroxine  50 mcg Oral QAC breakfast  . [MAR Hold] mouth rinse  15 mL Mouth Rinse BID  . [MAR Hold] pantoprazole  40 mg Oral Daily     Infusions: . sodium chloride 75 mL/hr at 01/13/16 2307  . [MAR Hold] DOPamine 2 mcg/kg/min (01/13/16 2239)    No Known Allergies  Social History   Social History  . Marital status: Widowed    Spouse name: N/A  . Number of children: N/A  . Years of education: N/A   Occupational History  . Not on file.   Social History Main Topics  . Smoking status: Never Smoker  . Smokeless tobacco: Never Used  . Alcohol use No  . Drug use: No  . Sexual activity: Not on file   Other Topics Concern  . Not on file   Social History Narrative  . No narrative on file    No family history on file.  PHYSICAL EXAM: Vitals:   01/13/16 2315 01/13/16 2320  BP:    Pulse: (!) 0 (!) 0  Resp: (!) 0 (!) 0  Temp:       Intake/Output Summary (Last 24 hours) at 01/13/16 2346 Last data filed at 01/13/16 2314  Gross per 24 hour  Intake            168.2 ml  Output              270 ml  Net           -101.8 ml    General:  Well appearing. No respiratory difficulty HEENT: normal Neck: supple. no JVD. Carotids 2+ bilat; no bruits.  Cor: PMI nondisplaced. Regular rate & rhythm. No rubs, gallops or murmurs. Lungs: clear Abdomen: soft, nontender, nondistended. No hepatosplenomegaly.  Extremities: no cyanosis, clubbing, rash, edema Neuro: alert & oriented x 3, cranial nerves grossly intact. moves all 4 extremities w/o difficulty. ECG:  Results for orders placed or performed during the hospital encounter of 01/13/16 (from the past 24 hour(s))  MRSA PCR Screening     Status: None   Collection Time: 01/13/16  8:12 PM  Result Value Ref Range   MRSA by PCR NEGATIVE NEGATIVE   Dg Chest 1 View  Result Date: 01/13/2016 CLINICAL DATA:  Heart rate elevated EXAM: CHEST 1 VIEW COMPARISON:  01/04/2016 FINDINGS: Aortic atherosclerosis. Heart size is normal. Chronic interstitial coarsening is noted bilaterally No pleural effusion or edema. No airspace consolidation. Chronic left lateral rib fracture  deformities noted. IMPRESSION: 1. Chronic lung disease without superimposed acute cardiopulmonary abnormality. 2. Aortic atherosclerosis. Electronically Signed   By: Signa Kell M.D.   On: 01/13/2016 16:15   Ct Head Wo Contrast  Result Date: 01/13/2016 CLINICAL DATA:  Seizures. EXAM: CT HEAD WITHOUT CONTRAST TECHNIQUE: Contiguous axial images were obtained from the base of the skull through the vertex without intravenous contrast. COMPARISON:  01/04/2016 FINDINGS: Brain: There is low attenuation within the subcortical and periventricular white matter compatible with chronic microvascular disease. 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 There is no abnormal extra-axial fluid collection, intracranial hemorrhage or mass. No evidence for acute brain infarct. Vascular: No hyperdense vessel or unexpected calcification. Skull: The osseous skull is intact. Sinuses/Orbits: Mucosal thickening involving the left maxillary sinus. The remaining paranasal sinuses and the mastoid air cells are clear. Other: Gas is identified within the soft tissues overlying the right side of face. IMPRESSION: 1. No acute intracranial abnormalities. 2. Chronic microvascular disease Electronically Signed   By: Signa Kell M.D.   On: 01/13/2016 16:59   EKG: Afib with PVCs  Tele showing complete heart block with no escape rhythm   ASSESSMENT: 1. Atrial fibrillation CHADSVASC2 = 5 with intermittent complete heart block   PLAN/DISCUSSION: 1. Will place TVP tonight 2. EP consultation in am  3. Rule out reversible causes, 4. Will likely need PPM  Diana Braun Alonna Minium

## 2016-01-14 NOTE — Progress Notes (Signed)
PULMONARY / CRITICAL CARE MEDICINE   Name: Diana Braun MRN: [id number] DOB: January 25, 1923    ADMISSION DATE:  01/13/2016 CONSULTATION DATE:  01/13/2016  REFERRING MD:  [facility] EDP Dr. Mayford Knife  CHIEF COMPLAINT:  Syncope  HISTORY OF PRESENT ILLNESS:   82 year old female with PMH as below, which is significant for DM (now off meds), HTN, hypothyroidism, and HLD. She is [place] and has only been in the [place] for a few months. She has already been hospitalized once 8/7-8/9. Chief complaint at that time was dizziness and syncope. She was found to have UTI secondary to GBS and was treated with amoxicillin. Symptoms of orthostasis, dizziness, and syncope were attributed to UTI.   She was discharged to home with home health, however, she presented to ED again 8/18 with complaint of syncope and shaking during LOC. Workup was negative and she was discharged home with cardiology and neurology follow up.  8/27 she again presented to ED at [facility] for syncope with "seizure-like" activity and nausea. Upon arrival to the ED she was actively seizing and vomiting. Very brief duration.  Treated with Keppra. On telemetry seizures would coincide with long pauses. Patient had several different arrythmias in ED including ST, AF, "escape rhythm" and CHB. She was transferred to [facility] for cardiology evaluation and pacing.   SUBJECTIVE:  Temp pacer placed overnight but now pacer back up rate down to 40.  VITAL SIGNS: BP 110/89 (BP Location: Left Arm)   Pulse 61   Temp 98.1 F (36.7 C) (Oral)   Resp (!) 22   Ht 4\' 11"  (1.499 m)   Wt 53.8 kg (118 lb 9.7 oz)   SpO2 93%   BMI 23.96 kg/m   HEMODYNAMICS:    VENTILATOR SETTINGS: FiO2 (%):  [0 %] 0 %  INTAKE / OUTPUT: I/O last 3 completed shifts: In: 773.1 [I.V.:773.1] Out: 1345 [Urine:1345]  PHYSICAL EXAMINATION: General:  Elderly female in NAD Neuro:  Alert and interactive, moving all ext to command. HEENT:  Ecchymosis to both sides of  face. Cardiovascular:  Currently NST, no MRG Lungs:  clear Abdomen:  Soft, non-tender, non-distended Musculoskeletal:  No acute deformity Skin:  Grossly intact  LABS:  BMET  Recent Labs Lab 01/13/16 1700 01/13/16 2359 01/14/16 0548  NA 130* 133* 135  K 4.4 4.9 4.4  CL 98* 100* 104  CO2 21* 25 24  BUN 53* 42* 37*  CREATININE 1.26* 1.29* 1.17*  GLUCOSE 257* 159* 99   Electrolytes  Recent Labs Lab 01/13/16 1549 01/13/16 1700 01/13/16 2359 01/14/16 0548  CALCIUM  --  8.4* 8.7* 8.3*  MG 1.8  --  1.5* 1.5*  PHOS  --   --  3.9 3.6   CBC  Recent Labs Lab 01/13/16 1549 01/13/16 2359 01/14/16 0548  WBC 18.9* 15.9* 11.1*  HGB 12.2 11.6* 9.9*  HCT 36.1 35.5* 30.3*  PLT 293 259 234   Coag's  Recent Labs Lab 01/13/16 2359  INR 1.14   Sepsis Markers No results for input(s): LATICACIDVEN, PROCALCITON, O2SATVEN in the last 168 hours.  ABG No results for input(s): PHART, PCO2ART, PO2ART in the last 168 hours.  Liver Enzymes  Recent Labs Lab 01/13/16 1700 01/13/16 2359  AST 141* 106*  ALT 146* 143*  ALKPHOS 49 48  BILITOT 0.6 1.0  ALBUMIN 3.3* 3.4*   Cardiac Enzymes  Recent Labs Lab 01/13/16 1549 01/13/16 2359 01/14/16 0548  TROPONINI 0.03* 0.04* 0.05*   Glucose  Recent Labs Lab 01/14/16  0746  GLUCAP 88   Imaging Dg Chest 1 View  Result Date: 01/13/2016 CLINICAL DATA:  Heart rate elevated EXAM: CHEST 1 VIEW COMPARISON:  01/04/2016 FINDINGS: Aortic atherosclerosis. Heart size is normal. Chronic interstitial coarsening is noted bilaterally No pleural effusion or edema. No airspace consolidation. Chronic left lateral rib fracture deformities noted. IMPRESSION: 1. Chronic lung disease without superimposed acute cardiopulmonary abnormality. 2. Aortic atherosclerosis. Electronically Signed   By: Signa Kell M.D.   On: 01/13/2016 16:15   Ct Head Wo Contrast  Result Date: 01/13/2016 CLINICAL DATA:  Seizures. EXAM: CT HEAD WITHOUT CONTRAST  TECHNIQUE: Contiguous axial images were obtained from the base of the skull through the vertex without intravenous contrast. COMPARISON:  01/04/2016 FINDINGS: Brain: There is low attenuation within the subcortical and periventricular white matter compatible with chronic microvascular disease. 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 There is no abnormal extra-axial fluid collection, intracranial hemorrhage or mass. No evidence for acute brain infarct. Vascular: No hyperdense vessel or unexpected calcification. Skull: The osseous skull is intact. Sinuses/Orbits: Mucosal thickening involving the left maxillary sinus. The remaining paranasal sinuses and the mastoid air cells are clear. Other: Gas is identified within the soft tissues overlying the right side of face. IMPRESSION: 1. No acute intracranial abnormalities. 2. Chronic microvascular disease Electronically Signed   By: Signa Kell M.D.   On: 01/13/2016 16:59   STUDIES:  CT head 8/27 > no acute abnormalities  CULTURES: BCx2 8/27 >  ANTIBIOTICS: None  SIGNIFICANT EVENTS: 8/7-8/9 > admit to [facility] for syncope, diagnosed with UTI   LINES/TUBES: PIV R femoral temp wire 8/27>>>  DISCUSSION: 82 year old female with syncope and seizures. Found to be bradycardic in ED and transferred to [facility] for further eval. Cardiology placed temp pacer 8/27 PM.   ASSESSMENT / PLAN:  PULMONARY A: No acute issues  P:   Supplemental O2 if needed to keep sats> 92%, currently on 2L [place]. Titrate O2 for sat of 88-92%.  CARDIOVASCULAR A:  Atrial fibrillation Bradycardia  P:  Cardiology following and managing pacer D/C Dopamine Hold home antihypertensives  RENAL A:   AKI Hyponatremia  P:   KVO IVF Follow BMP in AM. Replace electrolytes as indicated.  GASTROINTESTINAL A:   No acute issues  P:   Heart healthy diet Pepcid  HEMATOLOGIC A:   No acute issues  P:  Follow CBC SQ heparin for VTE ppx  INFECTIOUS A:    Leukocytosis, no clear source or concern for infection besides WBC  P:   Defer ABX for now Culture blood  ENDOCRINE A:   Hyperglycemia with history of DM Hypothyroidism  P:   TSH CBG monitoring and SSI  NEUROLOGIC A:   Seizures   P:   Continue scheduled Keppra Neurology consult called.  FAMILY  - Updates: No family bedside, patient updated.  - Inter-disciplinary family meet or Palliative Care meeting due by:9/3  Hold in the ICU while temp pacer is in place until cards decides plan.  The patient is critically ill with multiple organ systems failure and requires high complexity decision making for assessment and support, frequent evaluation and titration of therapies, application of advanced monitoring technologies and extensive interpretation of multiple databases.   Critical Care Time devoted to patient care services described in this note is  35  Minutes. This time reflects time of care of this signee Dr Koren Bound. This critical care time does not reflect procedure time, or teaching time or supervisory time of PA/NP/Med  student/Med Resident etc but could involve care discussion time.  Alyson ReedyWesam G. Yacoub, M.D. [facility]. Pager: [phone]. After hours pager: [phone].  01/14/2016 10:29 AM

## 2016-01-14 NOTE — Consult Note (Addendum)
ELECTROPHYSIOLOGY CONSULT NOTE    Patient ID: Diana Braun MRN: [id number], DOB/AGE: [birth year] 82 y.o.  Admit date: 01/13/2016 Date of Consult: 01/14/2016  Primary Physician: Donnie Coffin, MD Primary Cardiologist: new  Reason for Consultation: asystole, pauses  HPI: Diana Braun is a 82 y.o. female who rcenetly moved to the [place] to live with her daughter (who is at bedside), was originally seen at [facility] after a syncopal event, while in the ED she was observed to have syncope/seizure-like activity, vomiting, that correlated with episodes of CHB and pauses and transferred to [facility] for further care and management, admitted to the ICU with a temp pacing wire in place.  PMHx includes DM, HTN (on lopressor), PAFib, she was recently hospitalized (discharged 12/26/15) after a fall/dizzy spell and found to have a UTI, orthostasis.dehydration which was felt to be the etiology of the dizzy spell/fall at that time, the daughter reports that she feels the travel/flight jet lag also was a contributor.  No clear syncope/falls prior to the lopressor initiation otherwise.  She was reported to arrive in AFib, though converted to SR during her stay with plans to f/uout patient to discuss a/c given fall and trauma at that time. TSH was wnl, it was during this stay that the lopressor was started, and has been on it daily, last dose yesterday morning per the daughter.  Admit LABS: BUN/Creat 53/1.26 >> 37/1.17 K+ 4.4 Mag 1.8 H/H 12/36 WBC 18.9 >> 11.1 plts 293 Trop 0.03, 0.04, 0.05  Home med includes Lopressor 69m BID, last dose 01/14/16 morning at home  Past Medical History:  Diagnosis Date  . Anemia   . [place]   . Hyperlipidemia   . Hypertension   . [place]   . Thyroid disease      Surgical History:  Past Surgical History:  Procedure Laterality Date  . CARDIAC CATHETERIZATION N/A 01/13/2016   Procedure: Temporary Pacemaker;  Surgeon: Peter M JMartinique MD;   [facility];  Service: Cardiovascular;  Laterality: N/A;  . HIP SURGERY Right   . KNEE SURGERY Right      Prescriptions Prior to Admission  Medication Sig Dispense Refill Last Dose  . acetaminophen (TYLENOL) 500 MG tablet Take 500 mg by mouth every 6 (six) hours as needed (pain).     . cephALEXin (KEFLEX) 500 MG capsule Take 1 capsule (500 mg total) by mouth 3 (three) times daily. (Patient taking differently: Take 500 mg by mouth 3 (three) times daily. 7 day course filled 01/04/16) 21 capsule 0 unknown  . levothyroxine (SYNTHROID, LEVOTHROID) 50 MCG tablet Take 50 mcg by mouth daily.   unknown  . metoprolol tartrate (LOPRESSOR) 25 MG tablet Take 1 tablet (25 mg total) by mouth 2 (two) times daily. 60 tablet 0 unknown  . predniSONE (DELTASONE) 20 MG tablet Take 1 tablet (20 mg total) by mouth daily. (Patient taking differently: Take 20 mg by mouth daily. #10 filled 01/04/16) 10 tablet 0 unknown    Inpatient Medications:  . heparin  5,000 Units Subcutaneous Q8H  . levothyroxine  50 mcg Oral QAC breakfast  . mouth rinse  15 mL Mouth Rinse BID  . pantoprazole  40 mg Oral Daily    Allergies: No Known Allergies  Social History   Social History  . Marital status: Widowed    Spouse name: N/A  . Number of children: N/A  . Years of education: N/A   Occupational History  . Not on file.  Social History Main Topics  . Smoking status: Never Smoker  . Smokeless tobacco: Never Used  . Alcohol use No  . Drug use: No  . Sexual activity: Not on file   Other Topics Concern  . Not on file   Social History Narrative  . No narrative on file     Family history  No premature CAD, no sudden death.  Review of Systems: All other systems reviewed and are otherwise negative except as noted above.  Physical Exam: Vitals:   01/14/16 0630 01/14/16 0700 01/14/16 0732 01/14/16 0800  BP: 134/67 (!) 110/55  110/89  Pulse: 60 60  61  Resp: 18 14  (!) 22  Temp:   98.1 F (36.7  C)   TempSrc:   Oral   SpO2: 100% 100%  93%  Weight:      Height:        GEN- The patient is well appearing, alert and oriented x 3 today.   HEENT: normocephalic, large areas of ecchymosis face; sclera clear, conjunctiva pink; hearing intact; oropharynx clear; neck supple, no JVP Lymph- no cervical lymphadenopathy Lungs- Clear to ausculation bilaterally, normal work of breathing.  No wheezes, rales, rhonchi Heart- Regular rate and rhythm, no significant murmurs, no rubs or gallops, PMI not laterally displaced GI- soft, non-tender, non-distended Extremities- no clubbing, cyanosis, or edema; wam extremities b/l MS- no significant deformity or atrophy Skin- warm and dry, no rash or lesion Psych- euthymic mood, full affect Neuro- no gross deficits observed  Labs:   Lab Results  Component Value Date   WBC 11.1 (H) 01/14/2016   HGB 9.9 (L) 01/14/2016   HCT 30.3 (L) 01/14/2016   MCV 96.5 01/14/2016   PLT 234 01/14/2016    Recent Labs Lab 01/13/16 2359 01/14/16 0548  NA 133* 135  K 4.9 4.4  CL 100* 104  CO2 25 24  BUN 42* 37*  CREATININE 1.29* 1.17*  CALCIUM 8.7* 8.3*  PROT 7.3  --   BILITOT 1.0  --   ALKPHOS 48  --   ALT 143*  --   AST 106*  --   GLUCOSE 159* 99      Radiology/Studies: Dg Chest 1 View Result Date: 01/13/2016 CLINICAL DATA:  Heart rate elevated EXAM: CHEST 1 VIEW COMPARISON:  01/04/2016 FINDINGS: Aortic atherosclerosis. Heart size is normal. Chronic interstitial coarsening is noted bilaterally No pleural effusion or edema. No airspace consolidation. Chronic left lateral rib fracture deformities noted. IMPRESSION: 1. Chronic lung disease without superimposed acute cardiopulmonary abnormality. 2. Aortic atherosclerosis. Electronically Signed   By: Kerby Moors M.D.   On: 01/13/2016 16:15    Ct Head Wo Contrast Result Date: 01/13/2016 CLINICAL DATA:  Seizures. EXAM: CT HEAD WITHOUT CONTRAST TECHNIQUE: Contiguous axial images were obtained from the base  of the skull through the vertex without intravenous contrast. COMPARISON:  01/04/2016 FINDINGS: Brain: There is low attenuation within the subcortical and periventricular white matter compatible with chronic microvascular disease. 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 There is no abnormal extra-axial fluid collection, intracranial hemorrhage or mass. No evidence for acute brain infarct. Vascular: No hyperdense vessel or unexpected calcification. Skull: The osseous skull is intact. Sinuses/Orbits: Mucosal thickening involving the left maxillary sinus. The remaining paranasal sinuses and the mastoid air cells are clear. Other: Gas is identified within the soft tissues overlying the right side of face. IMPRESSION: 1. No acute intracranial abnormalities. 2. Chronic microvascular disease Electronically Signed   By: Queen Slough.D.  On: 01/13/2016 16:59    Ct Angio Neck W And/or Wo Contrast Result Date: 01/04/2016 CLINICAL DATA:  Syncopal episodes. Fell 1 week ago. RIGHT-sided facial swelling. Possible seizure. EXAM: CT ANGIOGRAPHY HEAD AND NECK TECHNIQUE: Multidetector CT imaging of the head and neck was performed using the standard protocol during bolus administration of intravenous contrast. Multiplanar CT image reconstructions and MIPs were obtained to evaluate the vascular anatomy. Carotid stenosis measurements (when applicable) are obtained utilizing NASCET criteria, using the distal internal carotid diameter as the denominator. CONTRAST:  Isovue 370 75 mL. COMPARISON:  CT of the head, cervical spine, and face 12/24/2015. Noncontrast CT head earlier today FINDINGS: CT HEAD Calvarium and skull base: No fracture or destructive lesion. Mastoids and middle ears are grossly clear. Large hematoma RIGHT face. Paranasal sinuses: Imaged portions demonstrate chronic sinus disease LEFT maxillary region. No blowout injury. Orbits: BILATERAL cataract extraction. Brain: No evidence of acute  abnormality, including acute infarct, hemorrhage, hydrocephalus, or mass lesion. Mild atrophy. Hypoattenuation of white matter consistent with chronic microvascular ischemic change. CTA NECK Aortic arch: Standard branching. Imaged portion shows no evidence of aneurysm or dissection. No significant stenosis of the major arch vessel origins. Right carotid system: There is a triangular soft tissue density projecting into the proximal RIGHT internal carotid artery. See for instance coronal image 90 series 7, axial images 70 series 4 and sagittal image 39 series 8. No luminal narrowing, and this appearance can be associated with coronary atherosclerosis or even a nonocclusive web, but given the recent trauma, a small dissection flap cannot be excluded. No distal emboli are evident. No evidence stenosis (50% or greater) or occlusion. Left carotid system: Minor atheromatous change. No evidence of dissection, stenosis (50% or greater) or occlusion. Vertebral arteries: RIGHT vertebral slightly larger. No ostial narrowing. No evidence of dissection, stenosis (50% or greater) or occlusion. Nonvascular soft tissues: Spondylosis. No neck masses. Airway midline. No lung apex lesion or pneumothorax. CTA HEAD Anterior circulation: No significant stenosis, proximal occlusion, aneurysm, or vascular malformation. Posterior circulation: No significant stenosis, proximal occlusion, aneurysm, or vascular malformation. Venous sinuses: As permitted by contrast timing, patent. Anatomic variants: None of significance. Delayed phase:   No abnormal intracranial enhancement. IMPRESSION: No acute stroke or hemorrhage is evident. No intracranial flow reducing lesion is observed. Triangular soft tissue density projecting into the proximal RIGHT internal carotid artery which could represent a small dissection flap. See discussion above. This is nonocclusive and non stenotic. No distal emboli are evident. A call is in to the ordering provider. Large  facial hematoma, redemonstrated. Electronically Signed   By: Staci Righter M.D.   On: 01/04/2016 18:19     EKG: Afib RBBB, 50bpm SB, 1st degree AVblock, RBBB, LAFB TELEMETRY: currently SR 50's-60's Long pauses, asystolic event prior to temp wire last evening, as long as 14 seconds in duration.  12/24/15: Echocardiogram Study Conclusions - Left ventricle: The cavity size was mildly dilated. Systolic   function was normal. The estimated ejection fraction was 60%.   Wall motion was normal; there were no regional wall motion   abnormalities. The study is not technically sufficient to allow   evaluation of LV diastolic function. - Aortic valve: There was trivial regurgitation. - Mitral valve: There was mild regurgitation. - Left atrium: The atrium was mildly dilated. - Right ventricle: The cavity size was mildly dilated. - Atrial septum: No defect or patent foramen ovale was identified. - Tricuspid valve: There was severe regurgitation. - Pulmonary arteries: PA peak pressure: 45 mm  Hg (S). Impressions: - The right ventricular systolic pressure was increased consistent   with moderate pulmonary hypertension. Normal EF, and no thrombi   seen.   Assessment and Plan:   1. Syncope     Witnessed events in ED, ICU correlated with CHB/pauses     S/p temp wire R groin     Last dose of lopressor yesterday AM     SR 50's-60's this morning, temp pacer turned down to back-up 40bpm     Will allow complete washout of BB, monitor closely today     May likely need a PPM  2. HTN     BP stable  3. PAFib     CHA2DS2Vasc is at least 5, not currently on a/c     S/p fall with significant facial trauma, hematoma few weeks back, evidence of this remains     She is in SR currently, possible need for PPM, will hold off on a/c at this time  4. Minimal troponin elevation     No reports of CP     Not felt to represent acute event     Likely demand secondary to profound bradycardia  5. leukocutosis      WBC 18.9  >> 15.9 >> 11.1     Recently started on steroid 01/04/16 at ER visit for elevated ESR     afebrile     Deferred to CCM        Signed, Tommye Standard, PA-C 01/14/2016 8:46 AM  EP Attending  Patient seen and examined. Agree with above. The patient has had recurrent seizure activity which appears to be do to prolonged periods of asystole. She has evidence of both AV and sinus node disease and also has PAF. She has been on a beta blocker and her last dose was the a.m. On 8/27. The underwent insertion of a temporary TV pacer via the left femoral vein. She has had recurrent conduction. We have turned her PM down to VVI 40. On exam she has a large left sided hematoma. She is elderly and frail. No CHF symptoms on my exam. Tele reveals ventricular pacing and NSR A/P 1. Asystole - she is s/p temp PM. I suspect she will continue to have problems despite washout of her beta blocker and will ultimately need a PPM. Will observe off of her beta blocker. If she has any pacing, then PPM would be recommended. 2. HTN - her blood pressure is stable.  3. PAF - she is currently in NSR. She will need to be considered for a NOAC.  4. Hematoma - this is quite large. Hopefully will resolve with out complication.   Mikle Bosworth.D

## 2016-01-14 NOTE — Procedures (Signed)
ELECTROENCEPHALOGRAM REPORT  Date of Study: 01/14/2016  Patient's Name: Diana Braun MRN: [id number] Date of Birth: Oct 05, 1922  Referring Provider: Dr. Rhona Leavensimothy Oster  Clinical History: This is a 82 year old woman with convulsions.  Medications: acetaminophen (TYLENOL) tablet 325 mg  levothyroxine (SYNTHROID, LEVOTHROID) tablet 50 mcg  pantoprazole (PROTONIX) EC tablet 40 mg   Technical Summary: A multichannel digital EEG recording measured by the international 10-20 system with electrodes applied with paste and impedances below 5000 ohms performed in our laboratory with EKG monitoring in an awake and asleep patient.  Hyperventilation and photic stimulation were not performed.  The digital EEG was referentially recorded, reformatted, and digitally filtered in a variety of bipolar and referential montages for optimal display.    Description: The patient is awake and asleep during the recording.  During maximal wakefulness, there is a symmetric, medium voltage 8.5 Hz posterior dominant rhythm that attenuates with eye opening.  There is occasional focal 4-5 Hz theta slowing seen over the left temporal region, at times sharply contoured without clear epileptogenic potential. During drowsiness and sleep, there is an increase in theta slowing of the background, with shifting asymmetry over the bilateral temporal regions, left greater than right. Vertex waves and poorly formed sleep spindles were seen.  Hyperventilation and photic stimulation were not performed.  There were no clear epileptiform discharges or electrographic seizures seen.    EKG lead was unremarkable.  Impression: This awake and asleep EEG is abnormal due to occasional focal slowing over the left temporal region.  Clinical Correlation of the above findings indicates focal cerebral dysfunction over the left temporal region suggestive of underlying structural or physiologic abnormality. The absence of epileptiform discharges does not  exclude a clinical diagnosis of epilepsy. Clinical correlation is advised.   Diana Braun, M.D.

## 2016-01-14 NOTE — Progress Notes (Signed)
Upon assessment pt complained of 7/10 chest pain. Pt unable to describe exact feeling of chest pain. Daughter at bedside interpreting. Ekg obtained, MD notified. Upon reassessment of pt's chest pain, pt states, "Its more when I move around. It goes away when I sit still." PRN order for tylenol obtained. Will continue to monitor closely.  Lylah Lantis N. Jamelle HaringSnow, RN

## 2016-01-14 NOTE — Consult Note (Signed)
Neurology Consult Note  Reason for Consultation: Seizure  Requesting provider: Molli KnockYacoub  CC: "shaking"  HPI: This is a 89-yo RH woman who [facility] ED on 8/27/17for possible seizure, passing out, and vomiting. The patient describes that she has had several spells where she starts shaking all over. She tells me that she is aware during the shaking and knows that it is happening. She also reports having significant nausea with occasional episodes of vomiting. I have reviewed the ED MD note. He indicated that on her arrival to the ED, she was actively seizing described as "shaking and poorly responsive and then starts profusely vomiting. The event lasted very briefly. This is generalized tonic-clonic like shaking episode." While in the ED, she had additional seizure-like episodes. However, it was noted that these were all associated with cardiac arrhythmias, specifically long pauses and asystole which seemed to provoke the seizure-like activity. During one episode, she went into asystole and was noted to clench her fists against her face with seizure-like activity lasting 30 seconds. PCCM consultation was obtained and noted that she had complete heart block with episodes felt to be due to reduced cerebral blood flow in the face of her arrhythmia. She was transferred [facility] for further management. Cardiology consultation was obtained and placed a transvenous pacemaker with plans for formal electrophysiology consultation today. Neurology is now consulted for further recommendations regarding possible seizure.  The patient was recently admitted from 8/7-12/26/15 after she presented with dizziness and a fall. During that admission, she was found to have urinary tract infection and it was felt that her dizziness was most likely related to her UTI and dehydration. She was documented to have orthostatic vital signs during the admission. She was discharged home with home health services.  One of the  patient's complaints today is her speech. She states that she is having difficulty articulating. She is indeed quite dysarthric. Her daughter is present at the bedside and reports that this is a new development, starting yesterday afternoon. Prior to this, her daughter states that she was able to speak much more clearly.  PMH:  Past Medical History:  Diagnosis Date  . Anemia   . Diabetes mellitus without complication (HCC)   . Hyperlipidemia   . Hypertension   . Seizures (HCC)   . Thyroid disease     PSH:  Past Surgical History:  Procedure Laterality Date  . CARDIAC CATHETERIZATION N/A 01/13/2016   Procedure: Temporary Pacemaker;  Surgeon: Peter M SwazilandJordan, MD;  [facility] LAB;  Service: Cardiovascular;  Laterality: N/A;  . HIP SURGERY Right   . KNEE SURGERY Right     Family history: No family history on file.  Social history:  Social History   Social History  . Marital status: Widowed    Spouse name: N/A  . Number of children: N/A  . Years of education: N/A   Occupational History  . Not on file.   Social History Main Topics  . Smoking status: Never Smoker  . Smokeless tobacco: Never Used  . Alcohol use No  . Drug use: No  . Sexual activity: Not on file   Other Topics Concern  . Not on file   Social History Narrative  . No narrative on file    Current outpatient meds: Current Meds  Medication Sig  . acetaminophen (TYLENOL) 500 MG tablet Take 500 mg by mouth every 6 (six) hours as needed (pain).  Marland Kitchen. levothyroxine (SYNTHROID, LEVOTHROID) 50 MCG tablet Take 50  mcg by mouth daily.  . metoprolol tartrate (LOPRESSOR) 25 MG tablet Take 1 tablet (25 mg total) by mouth 2 (two) times daily.  Marland Kitchen triamcinolone cream (KENALOG) 0.1 % Apply 1 application topically 2 (two) times daily as needed (rash).    Current inpatient meds:  Current Facility-Administered Medications  Medication Dose Route Frequency Provider Last Rate Last Dose  . 0.9 %  sodium chloride  infusion  250 mL Intravenous PRN Duayne Cal, NP      . acetaminophen (TYLENOL) tablet 325 mg  325 mg Oral Q6H PRN Storm Frisk, MD      . heparin injection 5,000 Units  5,000 Units Subcutaneous Q8H Storm Frisk, MD   5,000 Units at 01/14/16 0601  . insulin aspart (novoLOG) injection 0-5 Units  0-5 Units Subcutaneous QHS Storm Frisk, MD      . insulin aspart (novoLOG) injection 0-9 Units  0-9 Units Subcutaneous TID WC Storm Frisk, MD      . levothyroxine (SYNTHROID, LEVOTHROID) tablet 50 mcg  50 mcg Oral QAC breakfast Storm Frisk, MD   50 mcg at 01/14/16 [phone]  . magnesium sulfate 3 g in dextrose 5 % 100 mL IVPB  3 g Intravenous Once Storm Frisk, MD   3 g at 01/14/16 1114  . MEDLINE mouth rinse  15 mL Mouth Rinse BID Storm Frisk, MD      . pantoprazole (PROTONIX) EC tablet 40 mg  40 mg Oral Daily Storm Frisk, MD        Allergies: No Known Allergies  ROS: As per HPI. A full 14-point review of systems was performed and is otherwise unremarkable.  PE:  BP 110/89 (BP Location: Left Arm)   Pulse 61   Temp 98.1 F (36.7 C) (Oral)   Resp (!) 22   Ht 4\' 11"  (1.499 m)   Wt 53.8 kg (118 lb 9.7 oz)   SpO2 93%   BMI 23.96 kg/m   General: WDWN resting comfortably in bed. She is alert. She is oriented to hospital, month, situation. She is unable to correctly name the year but can pick it out when provided with 3 choices. Speech is markedly dysarthric though she is also edentulous. No obvious aphasia though English is not her primary language. Follows commands briskly. Affect is bright with congruent mood. Comportment is normal.  HEENT: She has a large hematoma over the right cheek. Numerous additional areas of ecchymosis are noted over the face and neck. Neck supple without LAD. MM dry, OP clear. She is largely edentulous. Sclerae anicteric. No conjunctival injection.  CV: Regular, no murmur. Carotid pulses full and symmetric, no bruits. Distal pulses 2+ and  symmetric.  Lungs: CTAB on anterior exam.  Abdomen: Soft, non-distended, non-tender. Bowel sounds present x4.  Extremities: No C/C/E. a knee immobilizer is in place on the right lower extremity. Neuro:  CN: Pupils are equal and round. They are symmetrically reactive from 3-->2 mm. EOMI without nystagmus. No reported diplopia. Facial sensation is intact to light touch. Face is symmetric at rest with normal strength and mobility. Hearing is intact to conversational voice. Palate elevates symmetrically and uvula is midline. Voice is normal in tone, pitch and quality. Bilateral SCM and trapezii are 5/5. Tongue is midline with normal bulk and mobility.  Motor: Normal bulk, tone, and strength with some give way in the right lower extremity. Effort does fluctuate during the examination. No tremor or other abnormal movements. No drift.  Sensation: Intact  to light touch.   DTRs: 2+, symmetric. Toes downgoing bilaterally. No pathologic reflexes.  Coordination: Finger-to-nose is without dysmetria. Finger taps are a little bit slow but symmetric and without decrement.    Labs:  Lab Results  Component Value Date   WBC 11.1 (H) 01/14/2016   HGB 9.9 (L) 01/14/2016   HCT 30.3 (L) 01/14/2016   PLT 234 01/14/2016   GLUCOSE 99 01/14/2016   CHOL 121 05/26/2013   TRIG 163 05/26/2013   HDL 26 (L) 05/26/2013   LDLCALC 62 05/26/2013   ALT 143 (H) 01/13/2016   AST 106 (H) 01/13/2016   NA 135 01/14/2016   K 4.4 01/14/2016   CL 104 01/14/2016   CREATININE 1.17 (H) 01/14/2016   BUN 37 (H) 01/14/2016   CO2 24 01/14/2016   TSH 0.705 01/13/2016   INR 1.14 01/13/2016   HGBA1C 6.2 (H) 12/24/2015    Imaging:  I have personally and independently reviewed the CT scan of the head without contrast from 01/13/16. This shows moderate diffuse generalized atrophy that appears to be appropriate for age. She has a moderate burden of chronic small vessel ischemic change in the bihemispheric white matter. 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 No obvious acute abnormality is present.   Assessment and Plan:  1. Possible seizure: She was noted to have what is described as generalized tonic clonic activity in the emergency department. However, this was all associated with arrhythmias on telemetry, including asystole and heart block. Given that these episodes appear to been provoked by arrhythmia and subsequent hypoperfusion of the brain, I do not think antiepileptic medication is empirically indicated. I would proceed with correction of her arrhythmia to see if this eradicate these episodes. I will check EEG to exclude any possible focus. I will defer antiepileptic medication for now unless she has an obviously abnormal an epileptogenic EEG or her spells do not improve with correction of her arrhythmia.  2. Dysarthria: By reports this is a new development. This is concerning for possible ischemic infarction in the setting of arrhythmia. No obvious acute ischemia was noted on her CT scan though this does not exclude this diagnosis. She was noted to have a small luminal irregularity in the proximal right internal carotid artery on a CT angiography of the neck on 01/04/16. This was not occlusive and its significance was unclear, with the radiologist interpreting it is possible coronary atherosclerosis or a nonocclusive web with consideration of a small dissection in the setting of her recent fall. This will need to be reexamined and I will repeat CT angiogram of the neck. Echocardiogram is pending. Recent hemoglobin A1c was 6.2 on 12/24/15. I will check fasting lipids. She would likely benefit from aspirin 81 mg daily if no contra indication from a cardiac perspective.  This was discussed with the patient and her daughter at the bedside. They're in agreement with the plan as noted. There were given the opportunity to ask any questions and these were addressed to their satisfaction.  Thank you for this  consultation. Please feel free to call with any questions or concerns. Neurology will continue to follow.

## 2016-01-14 NOTE — Progress Notes (Signed)
[facility]  Patient Status: Active (receiving services up to time of hospitalization)  [facility] is providing the following services: RN and PT  If patient discharges after hours, please call [phone].   Kizzie FurnishDonna Fellmy 01/14/2016, 11:50 AM

## 2016-01-14 NOTE — Progress Notes (Signed)
After placement of temporary pacemaker, MD wrote a PRN order or a right wrist restraint. When patient returned to unit, a right knee immobilizer was placed and patient safety mitts were placed. Patient did not need PRN order for right wrist restraints over night.

## 2016-01-14 NOTE — Progress Notes (Signed)
EEG completed; results pending.    

## 2016-01-14 NOTE — Progress Notes (Signed)
CRITICAL VALUE ALERT  Critical value received:  Troponin 0.04  Date of notification:  01/14/2016  Time of notification:  0128  Critical value read back:Yes.    Nurse who received alert:  Owens LofflerKaziah Miller, RN  MD notified (1st page):  Loney Lohathore, Cards Fellow   Time of first page:  [phone]  MD notified (2nd page):  Time of second page:  Responding MD:  Time MD responded:   *No new orders given

## 2016-01-14 NOTE — Progress Notes (Addendum)
2100: Upon assessment, patient right femoral site was shown to have saturated the guaze. Right hip around area was edematous and soft but painful to touch. RN changed dressing and applied new guaze with pressure tape to site. Patient is now laying flat. Cards Fellow paged regarding situation.   2130: Cards Fellow reported to bedside to assess patient's site. Fellow stated that guaze and tape were okay to use and to keep the patient laying flat with knee immobilizer applied. RN to continue monitoring patient and site for more oozing/bleeding.

## 2016-01-14 NOTE — Interval H&P Note (Signed)
History and Physical Interval Note:  01/14/2016 12:27 AM  Diana KungEva Braun  has presented today for surgery, with the diagnosis of intermittent ventricular pauses.  The various methods of treatment have been discussed with the patient and family. After consideration of risks, benefits and other options for treatment, the patient has consented to  Procedure(s): Temporary Pacemaker (N/A) as a surgical intervention .  The patient's history has been reviewed, patient examined, no change in status, stable for surgery.  I have reviewed the patient's chart and labs.  Questions were answered to the patient's satisfaction.     Theron Aristaeter [facility] Surgery CenterJordanMD,FACC 01/14/2016 12:27 AM

## 2016-01-15 ENCOUNTER — Encounter (HOSPITAL_COMMUNITY)
Admission: AD | Disposition: A | Discharge status: Home Health | Payer: Self-pay | Source: Other Acute Inpatient Hospital | Attending: Internal Medicine

## 2016-01-15 ENCOUNTER — Inpatient Hospital Stay (HOSPITAL_COMMUNITY): Payer: Medicare Other

## 2016-01-15 ENCOUNTER — Encounter (HOSPITAL_COMMUNITY): Payer: Self-pay | Admitting: Cardiology

## 2016-01-15 DIAGNOSIS — Z950 Presence of cardiac pacemaker: Secondary | ICD-10-CM

## 2016-01-15 DIAGNOSIS — I442 Atrioventricular block, complete: Secondary | ICD-10-CM

## 2016-01-15 DIAGNOSIS — I495 Sick sinus syndrome: Secondary | ICD-10-CM

## 2016-01-15 DIAGNOSIS — I959 Hypotension, unspecified: Secondary | ICD-10-CM

## 2016-01-15 DIAGNOSIS — R471 Dysarthria and anarthria: Secondary | ICD-10-CM

## 2016-01-15 HISTORY — DX: Atrioventricular block, complete: I44.2

## 2016-01-15 HISTORY — PX: EP IMPLANTABLE DEVICE: SHX172B

## 2016-01-15 HISTORY — DX: Presence of cardiac pacemaker: Z95.0

## 2016-01-15 LAB — CBC
HEMATOCRIT: 31.5 % — AB (ref 36.0–46.0)
HEMOGLOBIN: 10.1 g/dL — AB (ref 12.0–15.0)
MCH: 31.3 pg (ref 26.0–34.0)
MCHC: 32.1 g/dL (ref 30.0–36.0)
MCV: 97.5 fL (ref 78.0–100.0)
Platelets: 192 10*3/uL (ref 150–400)
RBC: 3.23 MIL/uL — AB (ref 3.87–5.11)
RDW: 14.5 % (ref 11.5–15.5)
WBC: 11 10*3/uL — AB (ref 4.0–10.5)

## 2016-01-15 LAB — COMPREHENSIVE METABOLIC PANEL
ALK PHOS: 45 U/L (ref 38–126)
ALT: 91 U/L — ABNORMAL HIGH (ref 14–54)
ANION GAP: 6 (ref 5–15)
AST: 52 U/L — ABNORMAL HIGH (ref 15–41)
Albumin: 3.1 g/dL — ABNORMAL LOW (ref 3.5–5.0)
BILIRUBIN TOTAL: 0.7 mg/dL (ref 0.3–1.2)
BUN: 35 mg/dL — ABNORMAL HIGH (ref 6–20)
CALCIUM: 8.7 mg/dL — AB (ref 8.9–10.3)
CO2: 25 mmol/L (ref 22–32)
CREATININE: 1.11 mg/dL — AB (ref 0.44–1.00)
Chloride: 104 mmol/L (ref 101–111)
GFR calc non Af Amer: 42 mL/min — ABNORMAL LOW (ref >60)
GFR, EST AFRICAN AMERICAN: 49 mL/min — AB (ref >60)
GLUCOSE: 98 mg/dL (ref 65–99)
Potassium: 4.5 mmol/L (ref 3.5–5.1)
Sodium: 135 mmol/L (ref 135–145)
TOTAL PROTEIN: 6.6 g/dL (ref 6.5–8.1)

## 2016-01-15 LAB — PROTEIN ELECTROPHORESIS, SERUM
A/G Ratio: 1.1 (ref 0.7–1.7)
ALBUMIN ELP: 3.5 g/dL (ref 2.9–4.4)
ALPHA-1-GLOBULIN: 0.2 g/dL (ref 0.0–0.4)
ALPHA-2-GLOBULIN: 0.6 g/dL (ref 0.4–1.0)
BETA GLOBULIN: 1.1 g/dL (ref 0.7–1.3)
GAMMA GLOBULIN: 1.4 g/dL (ref 0.4–1.8)
Globulin, Total: 3.3 g/dL (ref 2.2–3.9)
TOTAL PROTEIN ELP: 6.8 g/dL (ref 6.0–8.5)

## 2016-01-15 LAB — PHOSPHORUS: Phosphorus: 4.1 mg/dL (ref 2.5–4.6)

## 2016-01-15 LAB — GLUCOSE, CAPILLARY
Glucose-Capillary: 92 mg/dL (ref 65–99)
Glucose-Capillary: 152 mg/dL — ABNORMAL HIGH (ref 65–99)
Glucose-Capillary: 112 mg/dL — ABNORMAL HIGH (ref 65–99)

## 2016-01-15 LAB — MAGNESIUM: MAGNESIUM: 2.2 mg/dL (ref 1.7–2.4)

## 2016-01-15 SURGERY — PACEMAKER IMPLANT

## 2016-01-15 MED ORDER — ACETAMINOPHEN 325 MG PO TABS
325.0000 mg | ORAL_TABLET | ORAL | Status: DC | PRN
  Administered 2016-01-15 – 2016-01-18 (×5): 650 mg via ORAL
  Filled 2016-01-15 (×5): qty 2

## 2016-01-15 MED ORDER — IOPAMIDOL (ISOVUE-370) INJECTION 76%
INTRAVENOUS | Status: AC
  Administered 2016-01-15: 50 mL
  Filled 2016-01-15: qty 50

## 2016-01-15 MED ORDER — SODIUM CHLORIDE 0.9 % IR SOLN
Status: AC
  Filled 2016-01-15: qty 2

## 2016-01-15 MED ORDER — CEFAZOLIN SODIUM-DEXTROSE 2-3 GM-% IV SOLR
INTRAVENOUS | Status: DC | PRN
  Administered 2016-01-15: 2 g via INTRAVENOUS

## 2016-01-15 MED ORDER — ONDANSETRON HCL 4 MG/2ML IJ SOLN
4.0000 mg | Freq: Four times a day (QID) | INTRAMUSCULAR | Status: DC | PRN
  Administered 2016-01-16: 4 mg via INTRAVENOUS
  Filled 2016-01-15: qty 2

## 2016-01-15 MED ORDER — CEFAZOLIN IN D5W 1 GM/50ML IV SOLN
1.0000 g | Freq: Four times a day (QID) | INTRAVENOUS | Status: AC
  Administered 2016-01-15 – 2016-01-16 (×3): 1 g via INTRAVENOUS
  Filled 2016-01-15 (×3): qty 50

## 2016-01-15 MED ORDER — HEPARIN (PORCINE) IN NACL 2-0.9 UNIT/ML-% IJ SOLN
INTRAMUSCULAR | Status: DC | PRN
  Administered 2016-01-15: 500 mL

## 2016-01-15 MED ORDER — IOPAMIDOL (ISOVUE-370) INJECTION 76%
INTRAVENOUS | Status: DC | PRN
  Administered 2016-01-15: 15 mL via INTRAVENOUS

## 2016-01-15 MED ORDER — CEFAZOLIN SODIUM-DEXTROSE 2-4 GM/100ML-% IV SOLN
INTRAVENOUS | Status: AC
  Filled 2016-01-15: qty 100

## 2016-01-15 MED ORDER — SODIUM CHLORIDE 0.9 % IV SOLN
INTRAVENOUS | Status: DC
  Administered 2016-01-15: 150 mL via INTRAVENOUS

## 2016-01-15 MED ORDER — LIDOCAINE HCL (PF) 1 % IJ SOLN
INTRAMUSCULAR | Status: DC | PRN
  Administered 2016-01-15: 60 mL via SUBCUTANEOUS

## 2016-01-15 MED ORDER — CEFAZOLIN SODIUM-DEXTROSE 2-4 GM/100ML-% IV SOLN
2.0000 g | INTRAVENOUS | Status: AC
  Administered 2016-01-15: 2 g via INTRAVENOUS
  Filled 2016-01-15: qty 100

## 2016-01-15 MED ORDER — LIDOCAINE HCL (PF) 1 % IJ SOLN
INTRAMUSCULAR | Status: AC
  Filled 2016-01-15: qty 60

## 2016-01-15 MED ORDER — IOPAMIDOL (ISOVUE-370) INJECTION 76%
INTRAVENOUS | Status: AC
  Filled 2016-01-15: qty 50

## 2016-01-15 MED ORDER — SODIUM CHLORIDE 0.9 % IR SOLN
80.0000 mg | Status: AC
  Administered 2016-01-15: 80 mg
  Filled 2016-01-15: qty 2

## 2016-01-15 SURGICAL SUPPLY — 7 items
CABLE SURGICAL S-101-97-12 (CABLE) ×3 IMPLANT
LEAD TENDRIL MRI 46CM LPA1200M (Lead) ×3 IMPLANT
LEAD TENDRIL MRI 52CM LPA1200M (Lead) ×3 IMPLANT
PACEMAKER ASSURITY DR-RF (Pacemaker) ×3 IMPLANT
PAD DEFIB LIFELINK (PAD) ×3 IMPLANT
SHEATH CLASSIC 8F (SHEATH) ×6 IMPLANT
TRAY PACEMAKER INSERTION (PACKS) ×3 IMPLANT

## 2016-01-15 NOTE — Discharge Instructions (Signed)
° ° °  Supplemental Discharge Instructions for  Pacemaker/Defibrillator Patients  Activity No heavy lifting or vigorous activity with your left/right arm for 6 to 8 weeks.  Do not raise your left/right arm above your head for one week.  Gradually raise your affected arm as drawn below.             01/20/16                      01/21/16                       01/22/16                     01/23/16 __  NO DRIVING the patient does not drive  WOUND CARE - Keep the wound area clean and dry.  Do not get this area wet for one week. No showers for one week; you may shower on 01/23/16    . - The tape/steri-strips on your wound will fall off; do not pull them off.  No bandage is needed on the site.  DO  NOT apply any creams, oils, or ointments to the wound area. - If you notice any drainage or discharge from the wound, any swelling or bruising at the site, or you develop a fever > 101? F after you are discharged home, call the office at once.  Special Instructions - You are still able to use cellular telephones; use the ear opposite the side where you have your pacemaker/defibrillator.  Avoid carrying your cellular phone near your device. - When traveling through airports, show security personnel your identification card to avoid being screened in the metal detectors.  Ask the security personnel to use the hand wand. - Avoid arc welding equipment, MRI testing (magnetic resonance imaging), TENS units (transcutaneous nerve stimulators).  Call the office for questions about other devices. - Avoid electrical appliances that are in poor condition or are not properly grounded. - Microwave ovens are safe to be near or to operate.  Additional information for defibrillator patients should your device go off: - If your device goes off ONCE and you feel fine afterward, notify the device clinic nurses. - If your device goes off ONCE and you do not feel well afterward, call 911. - If your device goes off TWICE, call  911. - If your device goes off THREE times in one day, call 911.  DO NOT DRIVE YOURSELF OR A FAMILY MEMBER WITH A DEFIBRILLATOR TO THE HOSPITAL--CALL 911.

## 2016-01-15 NOTE — Progress Notes (Signed)
SUBJECTIVE: The patient is doing well today.  At this time, she denies chest pain, shortness of breath, or any new concerns.  Did have pacing via temporary wire overnight.  . heparin  5,000 Units Subcutaneous Q8H  . insulin aspart  0-5 Units Subcutaneous QHS  . insulin aspart  0-9 Units Subcutaneous TID WC  . levothyroxine  50 mcg Oral QAC breakfast  . mouth rinse  15 mL Mouth Rinse BID  . pantoprazole  40 mg Oral Daily      OBJECTIVE: Physical Exam: Vitals:   01/15/16 0400 01/15/16 0500 01/15/16 0600 01/15/16 0700  BP: (!) 109/49 (!) 112/58 140/81 (!) 112/50  Pulse: 61 63 (!) 107 62  Resp: 17 15 (!) 22 15  Temp:      TempSrc:      SpO2: 100% 100% 99% 100%  Weight:      Height:        Intake/Output Summary (Last 24 hours) at 01/15/16 1941 Last data filed at 01/15/16 0700  Gross per 24 hour  Intake             1070 ml  Output             3260 ml  Net            -2190 ml    Telemetry reveals sinus rhythm  GEN- The patient is well appearing, alert and oriented x 3 today.   Head- normocephalic, atraumatic Eyes-  Sclera clear, conjunctiva pink Ears- hearing intact Oropharynx- clear Neck- supple, no JVP Lymph- no cervical lymphadenopathy Lungs- Clear to ausculation bilaterally, normal work of breathing Heart- Regular rate and rhythm, no murmurs, rubs or gallops, PMI not laterally displaced GI- soft, NT, ND, + BS Extremities- no clubbing, cyanosis, or edema Skin- no rash or lesion Psych- euthymic mood, full affect Neuro- strength and sensation are intact  LABS: Basic Metabolic Panel:  Recent Labs  01/14/16 0548 01/15/16 0248  NA 135 135  K 4.4 4.5  CL 104 104  CO2 24 25  GLUCOSE 99 98  BUN 37* 35*  CREATININE 1.17* 1.11*  CALCIUM 8.3* 8.7*  MG 1.5* 2.2  PHOS 3.6 4.1   Liver Function Tests:  Recent Labs  01/13/16 2359 01/15/16 0248  AST 106* 52*  ALT 143* 91*  ALKPHOS 48 45  BILITOT 1.0 0.7  PROT 7.3 6.6  ALBUMIN 3.4* 3.1*   No results for  input(s): LIPASE, AMYLASE in the last 72 hours. CBC:  Recent Labs  01/13/16 1549  01/14/16 0548 01/15/16 0248  WBC 18.9*  < > 11.1* 11.0*  NEUTROABS 15.2*  --   --   --   HGB 12.2  < > 9.9* 10.1*  HCT 36.1  < > 30.3* 31.5*  MCV 96.1  < > 96.5 97.5  PLT 293  < > 234 192  < > = values in this interval not displayed. Cardiac Enzymes:  Recent Labs  01/13/16 2359 01/14/16 0548 01/14/16 1224  TROPONINI 0.04* 0.05* 0.04*   BNP: Invalid input(s): POCBNP D-Dimer: No results for input(s): DDIMER in the last 72 hours. Hemoglobin A1C: No results for input(s): HGBA1C in the last 72 hours. Fasting Lipid Panel:  Recent Labs  01/14/16 1224  CHOL 122  HDL 48  LDLCALC 57  TRIG 86  CHOLHDL 2.5   Thyroid Function Tests:  Recent Labs  01/13/16 2359  TSH 0.705   Anemia Panel: No results for input(s): VITAMINB12, FOLATE, FERRITIN, TIBC, IRON, RETICCTPCT in the last  72 hours.  RADIOLOGY: Ct Angio Head W Or Wo Contrast  Result Date: 01/04/2016 CLINICAL DATA:  Syncopal episodes. Fell 1 week ago. RIGHT-sided facial swelling. Possible seizure. EXAM: CT ANGIOGRAPHY HEAD AND NECK TECHNIQUE: Multidetector CT imaging of the head and neck was performed using the standard protocol during bolus administration of intravenous contrast. Multiplanar CT image reconstructions and MIPs were obtained to evaluate the vascular anatomy. Carotid stenosis measurements (when applicable) are obtained utilizing NASCET criteria, using the distal internal carotid diameter as the denominator. CONTRAST:  Isovue 370 75 mL. COMPARISON:  CT of the head, cervical spine, and face 12/24/2015. Noncontrast CT head earlier today FINDINGS: CT HEAD Calvarium and skull base: No fracture or destructive lesion. Mastoids and middle ears are grossly clear. Large hematoma RIGHT face. Paranasal sinuses: Imaged portions demonstrate chronic sinus disease LEFT maxillary region. No blowout injury. Orbits: BILATERAL cataract extraction.  Brain: No evidence of acute abnormality, including acute infarct, hemorrhage, hydrocephalus, or mass lesion. Mild atrophy. Hypoattenuation of white matter consistent with chronic microvascular ischemic change. CTA NECK Aortic arch: Standard branching. Imaged portion shows no evidence of aneurysm or dissection. No significant stenosis of the major arch vessel origins. Right carotid system: There is a triangular soft tissue density projecting into the proximal RIGHT internal carotid artery. See for instance coronal image 90 series 7, axial images 70 series 4 and sagittal image 39 series 8. No luminal narrowing, and this appearance can be associated with coronary atherosclerosis or even a nonocclusive web, but given the recent trauma, a small dissection flap cannot be excluded. No distal emboli are evident. No evidence stenosis (50% or greater) or occlusion. Left carotid system: Minor atheromatous change. No evidence of dissection, stenosis (50% or greater) or occlusion. Vertebral arteries: RIGHT vertebral slightly larger. No ostial narrowing. No evidence of dissection, stenosis (50% or greater) or occlusion. Nonvascular soft tissues: Spondylosis. No neck masses. Airway midline. No lung apex lesion or pneumothorax. CTA HEAD Anterior circulation: No significant stenosis, proximal occlusion, aneurysm, or vascular malformation. Posterior circulation: No significant stenosis, proximal occlusion, aneurysm, or vascular malformation. Venous sinuses: As permitted by contrast timing, patent. Anatomic variants: None of significance. Delayed phase:   No abnormal intracranial enhancement. IMPRESSION: No acute stroke or hemorrhage is evident. No intracranial flow reducing lesion is observed. Triangular soft tissue density projecting into the proximal RIGHT internal carotid artery which could represent a small dissection flap. See discussion above. This is nonocclusive and non stenotic. No distal emboli are evident. A call is in to  the ordering provider. Large facial hematoma, redemonstrated. Electronically Signed   By: Staci Righter M.D.   On: 01/04/2016 18:19   Dg Chest 1 View  Result Date: 01/13/2016 CLINICAL DATA:  Heart rate elevated EXAM: CHEST 1 VIEW COMPARISON:  01/04/2016 FINDINGS: Aortic atherosclerosis. Heart size is normal. Chronic interstitial coarsening is noted bilaterally No pleural effusion or edema. No airspace consolidation. Chronic left lateral rib fracture deformities noted. IMPRESSION: 1. Chronic lung disease without superimposed acute cardiopulmonary abnormality. 2. Aortic atherosclerosis. Electronically Signed   By: Kerby Moors M.D.   On: 01/13/2016 16:15   Dg Chest 2 View  Result Date: 01/04/2016 CLINICAL DATA:  Weakness, fall, possible seizure. EXAM: CHEST  2 VIEW COMPARISON:  12/24/2015 FINDINGS: Lateral view degraded by patient arm position. Osteopenia. Remote left rib trauma. Midline trachea. Mild cardiomegaly with transverse aortic atherosclerosis. No pleural effusion or pneumothorax. Clear lungs. IMPRESSION: No acute cardiopulmonary disease. Cardiomegaly without congestive failure. Electronically Signed   By: Adria Devon.D.  On: 01/04/2016 18:44   Dg Chest 2 View  Result Date: 12/24/2015 CLINICAL DATA:  Got up to go to the bathroom at 0300 hours, became dizzy and fell striking RIGHT side of face, no loss of consciousness, RIGHT facial swelling and bruising, RIGHT side neck pain, history thyroid disease EXAM: CHEST  2 VIEW COMPARISON:  05/25/2013 FINDINGS: Borderline enlargement of cardiac silhouette. Calcified tortuous aorta. Mediastinal contours pulmonary vascularity normal. Chronic interstitial prominence with without infiltrate, pleural effusion or pneumothorax. Bones demineralized with healing fracture of the posterolateral LEFT seventh rib. IMPRESSION: Borderline enlargement of cardiac silhouette. Chronic interstitial lung disease. Aortic atherosclerosis pre Electronically Signed   By: Lavonia Dana M.D.   On: 12/24/2015 08:43   Ct Head Wo Contrast  Result Date: 01/13/2016 CLINICAL DATA:  Seizures. EXAM: CT HEAD WITHOUT CONTRAST TECHNIQUE: Contiguous axial images were obtained from the base of the skull through the vertex without intravenous contrast. COMPARISON:  01/04/2016 FINDINGS: Brain: There is low attenuation within the subcortical and periventricular white matter compatible with chronic microvascular disease. 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 There is no abnormal extra-axial fluid collection, intracranial hemorrhage or mass. No evidence for acute brain infarct. Vascular: No hyperdense vessel or unexpected calcification. Skull: The osseous skull is intact. Sinuses/Orbits: Mucosal thickening involving the left maxillary sinus. The remaining paranasal sinuses and the mastoid air cells are clear. Other: Gas is identified within the soft tissues overlying the right side of face. IMPRESSION: 1. No acute intracranial abnormalities. 2. Chronic microvascular disease Electronically Signed   By: Kerby Moors M.D.   On: 01/13/2016 16:59   Ct Head Wo Contrast  Result Date: 01/04/2016 CLINICAL DATA:  [place] 3 days ago, nausea and dizziness since fall, episodic dizziness and chest tightness, diabetes mellitus, hypertension EXAM: CT HEAD WITHOUT CONTRAST TECHNIQUE: Contiguous axial images were obtained from the base of the skull through the vertex without intravenous contrast. COMPARISON:  12/24/2015 FINDINGS: Mild atrophy. Normal ventricular morphology. No midline shift or mass effect. Minimal small vessel chronic ischemic changes of deep cerebral white matter. No intracranial hemorrhage, mass lesion or evidence acute infarction. No extra-axial fluid collections. Atherosclerotic calcification of internal carotid and RIGHT vertebral arteries at skullbase. Bones demineralized. Paranasal sinuses and mastoid air cells clear. IMPRESSION: Atrophy with small vessel chronic ischemic  changes of deep cerebral white matter. No acute intracranial abnormalities. Electronically Signed   By: Lavonia Dana M.D.   On: 01/04/2016 14:38   Ct Head Wo Contrast  Result Date: 12/24/2015 CLINICAL DATA:  Fall. EXAM: CT HEAD WITHOUT CONTRAST CT MAXILLOFACIAL WITHOUT CONTRAST CT CERVICAL SPINE WITHOUT CONTRAST TECHNIQUE: Multidetector CT imaging of the head, cervical spine, and maxillofacial structures were performed using the standard protocol without intravenous contrast. Multiplanar CT image reconstructions of the cervical spine and maxillofacial structures were also generated. COMPARISON:  None. FINDINGS: CT HEAD FINDINGS Prominence of the sulci and ventricles are identified compatible with brain atrophy. There is diffuse low attenuation within the subcortical and periventricular white matter compatible with chronic microvascular disease. No abnormal extra-axial fluid collection, intracranial hemorrhage or mass identified. No evidence for acute brain infarct. The mastoid air cells appear clear. The calvarium is intact. CT MAXILLOFACIAL FINDINGS The mastoid air cells are clear. There is mild mucosal thickening involving the maxillaries sinuses. No orbital blowout fracture. The mandible is located. The visualized portions of the mandible are intact. The nasal bone is intact. The nasal septum is midline. There is a 4.5 cm hyperdense mass within the soft tissues overlying  the right side of face. This is compatible with a hematoma. CT CERVICAL SPINE FINDINGS Normal alignment of the cervical spine. The vertebral body heights are well preserved. The facet joints are appear all well aligned. Multi level disc space narrowing and ventral spurring noted compatible with degenerative disc disease. No fractures or subluxations identified. IMPRESSION: 1. No acute intracranial abnormalities. Small vessel ischemic disease and brain atrophy. 2. No evidence for facial bone fracture. There is a large hematoma involving the soft  tissues of the right-side of face. 3. No evidence for cervical spine fracture. There is multi level cervical degenerative disc disease Electronically Signed   By: Kerby Moors M.D.   On: 12/24/2015 09:17   Ct Angio Neck W And/or Wo Contrast  Result Date: 01/04/2016 CLINICAL DATA:  Syncopal episodes. Fell 1 week ago. RIGHT-sided facial swelling. Possible seizure. EXAM: CT ANGIOGRAPHY HEAD AND NECK TECHNIQUE: Multidetector CT imaging of the head and neck was performed using the standard protocol during bolus administration of intravenous contrast. Multiplanar CT image reconstructions and MIPs were obtained to evaluate the vascular anatomy. Carotid stenosis measurements (when applicable) are obtained utilizing NASCET criteria, using the distal internal carotid diameter as the denominator. CONTRAST:  Isovue 370 75 mL. COMPARISON:  CT of the head, cervical spine, and face 12/24/2015. Noncontrast CT head earlier today FINDINGS: CT HEAD Calvarium and skull base: No fracture or destructive lesion. Mastoids and middle ears are grossly clear. Large hematoma RIGHT face. Paranasal sinuses: Imaged portions demonstrate chronic sinus disease LEFT maxillary region. No blowout injury. Orbits: BILATERAL cataract extraction. Brain: No evidence of acute abnormality, including acute infarct, hemorrhage, hydrocephalus, or mass lesion. Mild atrophy. Hypoattenuation of white matter consistent with chronic microvascular ischemic change. CTA NECK Aortic arch: Standard branching. Imaged portion shows no evidence of aneurysm or dissection. No significant stenosis of the major arch vessel origins. Right carotid system: There is a triangular soft tissue density projecting into the proximal RIGHT internal carotid artery. See for instance coronal image 90 series 7, axial images 70 series 4 and sagittal image 39 series 8. No luminal narrowing, and this appearance can be associated with coronary atherosclerosis or even a nonocclusive web, but  given the recent trauma, a small dissection flap cannot be excluded. No distal emboli are evident. No evidence stenosis (50% or greater) or occlusion. Left carotid system: Minor atheromatous change. No evidence of dissection, stenosis (50% or greater) or occlusion. Vertebral arteries: RIGHT vertebral slightly larger. No ostial narrowing. No evidence of dissection, stenosis (50% or greater) or occlusion. Nonvascular soft tissues: Spondylosis. No neck masses. Airway midline. No lung apex lesion or pneumothorax. CTA HEAD Anterior circulation: No significant stenosis, proximal occlusion, aneurysm, or vascular malformation. Posterior circulation: No significant stenosis, proximal occlusion, aneurysm, or vascular malformation. Venous sinuses: As permitted by contrast timing, patent. Anatomic variants: None of significance. Delayed phase:   No abnormal intracranial enhancement. IMPRESSION: No acute stroke or hemorrhage is evident. No intracranial flow reducing lesion is observed. Triangular soft tissue density projecting into the proximal RIGHT internal carotid artery which could represent a small dissection flap. See discussion above. This is nonocclusive and non stenotic. No distal emboli are evident. A call is in to the ordering provider. Large facial hematoma, redemonstrated. Electronically Signed   By: Staci Righter M.D.   On: 01/04/2016 18:19   Ct Cervical Spine Wo Contrast  Result Date: 12/24/2015 CLINICAL DATA:  Fall. EXAM: CT HEAD WITHOUT CONTRAST CT MAXILLOFACIAL WITHOUT CONTRAST CT CERVICAL SPINE WITHOUT CONTRAST TECHNIQUE: Multidetector CT imaging  of the head, cervical spine, and maxillofacial structures were performed using the standard protocol without intravenous contrast. Multiplanar CT image reconstructions of the cervical spine and maxillofacial structures were also generated. COMPARISON:  None. FINDINGS: CT HEAD FINDINGS Prominence of the sulci and ventricles are identified compatible with brain  atrophy. There is diffuse low attenuation within the subcortical and periventricular white matter compatible with chronic microvascular disease. No abnormal extra-axial fluid collection, intracranial hemorrhage or mass identified. No evidence for acute brain infarct. The mastoid air cells appear clear. The calvarium is intact. CT MAXILLOFACIAL FINDINGS The mastoid air cells are clear. There is mild mucosal thickening involving the maxillaries sinuses. No orbital blowout fracture. The mandible is located. The visualized portions of the mandible are intact. The nasal bone is intact. The nasal septum is midline. There is a 4.5 cm hyperdense mass within the soft tissues overlying the right side of face. This is compatible with a hematoma. CT CERVICAL SPINE FINDINGS Normal alignment of the cervical spine. The vertebral body heights are well preserved. The facet joints are appear all well aligned. Multi level disc space narrowing and ventral spurring noted compatible with degenerative disc disease. No fractures or subluxations identified. IMPRESSION: 1. No acute intracranial abnormalities. Small vessel ischemic disease and brain atrophy. 2. No evidence for facial bone fracture. There is a large hematoma involving the soft tissues of the right-side of face. 3. No evidence for cervical spine fracture. There is multi level cervical degenerative disc disease Electronically Signed   By: Kerby Moors M.D.   On: 12/24/2015 09:17   Ct Maxillofacial Wo Contrast  Result Date: 12/24/2015 CLINICAL DATA:  Fall. EXAM: CT HEAD WITHOUT CONTRAST CT MAXILLOFACIAL WITHOUT CONTRAST CT CERVICAL SPINE WITHOUT CONTRAST TECHNIQUE: Multidetector CT imaging of the head, cervical spine, and maxillofacial structures were performed using the standard protocol without intravenous contrast. Multiplanar CT image reconstructions of the cervical spine and maxillofacial structures were also generated. COMPARISON:  None. FINDINGS: CT HEAD FINDINGS  Prominence of the sulci and ventricles are identified compatible with brain atrophy. There is diffuse low attenuation within the subcortical and periventricular white matter compatible with chronic microvascular disease. No abnormal extra-axial fluid collection, intracranial hemorrhage or mass identified. No evidence for acute brain infarct. The mastoid air cells appear clear. The calvarium is intact. CT MAXILLOFACIAL FINDINGS The mastoid air cells are clear. There is mild mucosal thickening involving the maxillaries sinuses. No orbital blowout fracture. The mandible is located. The visualized portions of the mandible are intact. The nasal bone is intact. The nasal septum is midline. There is a 4.5 cm hyperdense mass within the soft tissues overlying the right side of face. This is compatible with a hematoma. CT CERVICAL SPINE FINDINGS Normal alignment of the cervical spine. The vertebral body heights are well preserved. The facet joints are appear all well aligned. Multi level disc space narrowing and ventral spurring noted compatible with degenerative disc disease. No fractures or subluxations identified. IMPRESSION: 1. No acute intracranial abnormalities. Small vessel ischemic disease and brain atrophy. 2. No evidence for facial bone fracture. There is a large hematoma involving the soft tissues of the right-side of face. 3. No evidence for cervical spine fracture. There is multi level cervical degenerative disc disease Electronically Signed   By: Kerby Moors M.D.   On: 12/24/2015 09:17    ASSESSMENT AND PLAN:  Active Problems:   A-fib ([place])   Syncope and collapse   Heart block   Hyponatremia   Hypothyroidism   Convulsions ([place])  1. Syncope     Witnessed events in ED, ICU correlated with CHB/pauses     S/p temp wire R groin     Plan for pacemaker placement.  Risks and benefits discussed with the patient and family.  Risks include but are not limited to bleeding, infection, tamponade, pneumothorax.   Patient and daughter understand the risks and have agreed to the procedure.  2. HTN     BP stable  3. PAFib     CHA2DS2Vasc is at least 5, not currently on a/c     S/p fall with significant facial trauma, hematoma few weeks back, evidence of this remains     She is in SR currently, possible need for PPM, Jaylene Schrom hold off on a/c at this time  4. leukocutosis     WBC 18.9  >> 15.9 >> 11.1     Recently started on steroid 01/04/16 at ER visit for elevated ESR     afebrile     Deferred to CCM  Moo Gravley Meredith Leeds, MD 01/15/2016 7:22 AM

## 2016-01-15 NOTE — Progress Notes (Signed)
PULMONARY / CRITICAL CARE MEDICINE   Name: Diana Braun MRN: [id number] DOB: 1922/06/10    ADMISSION DATE:  01/13/2016 CONSULTATION DATE:  01/13/2016  REFERRING MD:  [facility] EDP Dr. Mayford Knife  CHIEF COMPLAINT:  Syncope  HISTORY OF PRESENT ILLNESS:   82 year old female with PMH as below, which is significant for DM (now off meds), HTN, hypothyroidism, and HLD. She is [place] and has only been in the [place] for a few months. She has already been hospitalized once 8/7-8/9. Chief complaint at that time was dizziness and syncope. She was found to have UTI secondary to GBS and was treated with amoxicillin. Symptoms of orthostasis, dizziness, and syncope were attributed to UTI.   She was discharged to home with home health, however, she presented to ED again 8/18 with complaint of syncope and shaking during LOC. Workup was negative and she was discharged home with cardiology and neurology follow up.  8/27 she again presented to ED at [facility] for syncope with "seizure-like" activity and nausea. Upon arrival to the ED she was actively seizing and vomiting. Very brief duration.  Treated with Keppra. On telemetry seizures would coincide with long pauses. Patient had several different arrythmias in ED including ST, AF, "escape rhythm" and CHB. She was transferred to [facility] for cardiology evaluation and pacing.   SUBJECTIVE:  Temp pacer placed and now in need of a permanent pacer.  To go to the cath lab for perm pacer today.  VITAL SIGNS: BP (!) 128/53   Pulse 73   Temp 98.3 F (36.8 C) (Oral)   Resp (!) 23   Ht 4\' 11"  (1.499 m)   Wt 55.2 kg (121 lb 11.1 oz)   SpO2 100%   BMI 24.58 kg/m   HEMODYNAMICS:    VENTILATOR SETTINGS:    INTAKE / OUTPUT: I/O last 3 completed shifts: In: 1843.1 [P.O.:530; I.V.:1313.1] Out: 4605 [Urine:4605]  PHYSICAL EXAMINATION: General:  Elderly female in NAD Neuro:  Alert and interactive, moving all ext to command. HEENT:  Ecchymosis to both  sides of face. Cardiovascular:  Currently NST, no MRG Lungs:  CTA bilaterally. Abdomen:  Soft, non-tender, non-distended Musculoskeletal:  No acute deformity Skin:  Grossly intact  LABS:  BMET  Recent Labs Lab 01/13/16 2359 01/14/16 0548 01/15/16 0248  NA 133* 135 135  K 4.9 4.4 4.5  CL 100* 104 104  CO2 25 24 25   BUN 42* 37* 35*  CREATININE 1.29* 1.17* 1.11*  GLUCOSE 159* 99 98   Electrolytes  Recent Labs Lab 01/13/16 2359 01/14/16 0548 01/15/16 0248  CALCIUM 8.7* 8.3* 8.7*  MG 1.5* 1.5* 2.2  PHOS 3.9 3.6 4.1   CBC  Recent Labs Lab 01/13/16 2359 01/14/16 0548 01/15/16 0248  WBC 15.9* 11.1* 11.0*  HGB 11.6* 9.9* 10.1*  HCT 35.5* 30.3* 31.5*  PLT 259 234 192   Coag's  Recent Labs Lab 01/13/16 2359  INR 1.14   Sepsis Markers No results for input(s): LATICACIDVEN, PROCALCITON, O2SATVEN in the last 168 hours.  ABG No results for input(s): PHART, PCO2ART, PO2ART in the last 168 hours.  Liver Enzymes  Recent Labs Lab 01/13/16 1700 01/13/16 2359 01/15/16 0248  AST 141* 106* 52*  ALT 146* 143* 91*  ALKPHOS 49 48 45  BILITOT 0.6 1.0 0.7  ALBUMIN 3.3* 3.4* 3.1*   Cardiac Enzymes  Recent Labs Lab 01/13/16 2359 01/14/16 0548 01/14/16 1224  TROPONINI 0.04* 0.05* 0.04*   Glucose  Recent Labs Lab 01/14/16 0746 01/14/16 1215  01/14/16 1731 01/14/16 2113 01/15/16 0749  GLUCAP 88 92 125* 112* 92   Imaging Ct Angio Neck W Or Wo Contrast  Result Date: 01/15/2016 CLINICAL DATA:  Dysarthria. Recent seizure like activity in the setting of cardiac arrhythmia. EXAM: CT ANGIOGRAPHY NECK TECHNIQUE: Multidetector CT imaging of the neck was performed using the standard protocol during bolus administration of intravenous contrast. Multiplanar CT image reconstructions and MIPs were obtained to evaluate the vascular anatomy. Carotid stenosis measurements (when applicable) are obtained utilizing NASCET criteria, using the distal internal carotid diameter  as the denominator. CONTRAST:  50 mL Isovue 370 COMPARISON:  01/04/2016 FINDINGS: Aortic arch: 3 vessel aortic arch with moderate atherosclerotic plaque. Atherosclerotic luminal irregularity involving the subclavian arteries without significant stenosis. Right carotid system: Atherosclerotic plaque at the carotid bifurcation without stenosis, unchanged. A 4 mm soft tissue density projecting into the posterior aspect of the proximal ICA lumen is unchanged. Left carotid system: Mild atherosclerotic plaque about the carotid bifurcation without stenosis, unchanged. Vertebral arteries: Patent vertebral arteries without evidence of stenosis. The right vertebral artery is slightly larger than the left. Skeleton: Mild multilevel cervical spondylosis. Moderate left-sided cervical facet arthrosis. Other neck: Right-sided facial hematoma has decreased in density and slightly decreased in size, now measuring 4.0 x 3.1 cm. Upper chest: Minimal dependent atelectasis in the upper lobes. IMPRESSION: 1. Atherosclerosis without significant stenosis. 2. Unchanged soft tissue density projecting into the proximal right ICA which could represent focal soft plaque/adherent thrombus, small intimal flap, or web. 3. Evolving right facial hematoma. Electronically Signed   By: Sebastian Ache M.D.   On: 01/15/2016 07:49   STUDIES:  CT head 8/27 > no acute abnormalities  CULTURES: BCx2 8/27 >  ANTIBIOTICS: None  SIGNIFICANT EVENTS: 8/7-8/9 > admit to [facility] for syncope, diagnosed with UTI   LINES/TUBES: PIV R femoral temp wire 8/27>>>  DISCUSSION: 82 year old female with syncope and seizures. Found to be bradycardic in ED and transferred to [facility] for further eval. Cardiology placed temp pacer 8/27 PM and perm pacer to be placed 8/29.  ASSESSMENT / PLAN:  PULMONARY A: No acute issues  P:   Supplemental O2 if needed to keep sats> 92%, currently on 2L [place]. Titrate O2 for sat of 88-92%.  CARDIOVASCULAR A:  Atrial  fibrillation Bradycardia  P:  Cardiology following and managing pacer D/C Dopamine Hold home antihypertensives  RENAL A:   AKI Hyponatremia  P:   KVO IVF Follow BMP in AM. Replace electrolytes as indicated.  GASTROINTESTINAL A:   No acute issues  P:   Heart healthy diet Pepcid  HEMATOLOGIC A:   No acute issues  P:  Follow CBC SQ heparin for VTE ppx  INFECTIOUS A:   Leukocytosis, no clear source or concern for infection besides WBC  P:   Defer ABX for now Culture blood  ENDOCRINE A:   Hyperglycemia with history of DM Hypothyroidism  P:   TSH CBG monitoring and SSI  NEUROLOGIC A:   Seizures   P:   Continue scheduled Keppra Neurology consult called.  FAMILY  - Updates: Daughter updated bedside 8/28.  - Inter-disciplinary family meet or Palliative Care meeting due by:9/3  Hold in the ICU until permanent pacer is in place.  The patient is critically ill with multiple organ systems failure and requires high complexity decision making for assessment and support, frequent evaluation and titration of therapies, application of advanced monitoring technologies and extensive interpretation of multiple databases.   Critical Care Time devoted to patient  care services described in this note is  35  Minutes. This time reflects time of care of this signee Dr Koren BoundWesam Seren Chaloux. This critical care time does not reflect procedure time, or teaching time or supervisory time of PA/NP/Med student/Med Resident etc but could involve care discussion time.  Alyson ReedyWesam G. Shaely Gadberry, M.D. [facility] Pulmonary/Critical Care Medicine. Pager: [phone]. After hours pager: [phone].  01/15/2016 10:53 AM

## 2016-01-15 NOTE — Evaluation (Signed)
PaPhysical Therapy Evaluation Patient Details Name: Diana Braun MRN: [id number] DOB: 17-Apr-1923 Today's Date: 01/15/2016   History of Present Illness  82 year old female with PMH as below, which is significant for DM (now off meds), HTN, hypothyroidism, and HLD.  She had recent hospitalization 8/7-8/9 due to dizziness and syncope and was treated for UTI.  Admitted with seizure-like activity and N&V; found to have several different arrythmias in ED including ST, AF, "escape rhythm" and CHB.  She is now s/p PPM.   Clinical Impression  Patient presents with decreased independence with mobility due to deficits listed in PT problem list.  She will benefit from skilled PT in the acute setting to allow return home with family support and resumption of HHPT services.  May also benefit from lightweight w/c or transport chair in the short term.    Follow Up Recommendations Home health PT (already active with HHPT)    Equipment Recommendations  Wheelchair (measurements PT)    Recommendations for Other Services       Precautions / Restrictions Precautions Precautions: Fall;ICD/Pacemaker Restrictions Weight Bearing Restrictions: No      Mobility  Bed Mobility Overal bed mobility: Needs Assistance Bed Mobility: Supine to Sit;Sit to Supine     Supine to sit: Mod assist;HOB elevated Sit to supine: Mod assist   General bed mobility comments: assist to lift trunk and scoot to EOB; assist for legs into bed and to lie down without using L arm  Transfers Overall transfer level: Needs assistance Equipment used: 1 person hand held assist Transfers: Sit to/from Stand;Stand Pivot Transfers Sit to Stand: Mod assist Stand pivot transfers: Mod assist       General transfer comment: bed <> BSC with assist, then standing from bed prior to ambulation with assist  Ambulation/Gait Ambulation/Gait assistance: Mod assist Ambulation Distance (Feet): 20 Feet Assistive device: 1 person hand held  assist Gait Pattern/deviations: Step-to pattern;Step-through pattern;Decreased stride length;Antalgic;Trunk flexed     General Gait Details: decreased stance time on L, reports both knees are weak; KI in bed, but did not Copydon  Stairs            Wheelchair Mobility    Modified Rankin (Stroke Patients Only)       Balance Overall balance assessment: Needs assistance   Sitting balance-Leahy Scale: Fair     Standing balance support: Single extremity supported Standing balance-Leahy Scale: Poor Standing balance comment: reaching for items with R UE and needing support for balance                             Pertinent Vitals/Pain Pain Assessment: Faces Faces Pain Scale: Hurts whole lot Pain Location: L shoulder at pacemaker insertion Pain Descriptors / Indicators: Sore Pain Intervention(s): Monitored during session;Limited activity within patient's tolerance;Repositioned    Home Living Family/patient expects to be discharged to:: Private residence Living Arrangements: Children Available Help at Discharge: Family;Available 24 hours/day Type of Home: House Home Access: Stairs to enter Entrance Stairs-Rails: None Entrance Stairs-Number of Steps: 4 Home Layout: One level Home Equipment: Walker - 2 wheels;Bedside commode      Prior Function Level of Independence: Needs assistance   Gait / Transfers Assistance Needed: Sup/assist for household mobilization with RW  ADL's / Homemaking Assistance Needed: Assist from daughter for ADLs and household chores as needed        Hand Dominance        Extremity/Trunk Assessment   Upper Extremity Assessment:  Generalized weakness           Lower Extremity Assessment: Generalized weakness      Cervical / Trunk Assessment: Kyphotic  Communication   Communication: HOH;Interpreter utilized;Prefers language other than English (patient speaks broken [place], daughter present to translate if not understanding,  but understands most [place])  Cognition Arousal/Alertness: Awake/alert Behavior During Therapy: WFL for tasks assessed/performed Overall Cognitive Status: Within Functional Limits for tasks assessed                      General Comments      Exercises        Assessment/Plan    PT Assessment Patient needs continued PT services  PT Diagnosis Abnormality of gait;Generalized weakness;Acute pain   PT Problem List Decreased strength;Decreased activity tolerance;Decreased balance;Decreased mobility;Decreased knowledge of precautions;Decreased knowledge of use of DME;Pain  PT Treatment Interventions DME instruction;Gait training;Functional mobility training;Stair training;Balance training;Therapeutic exercise;Therapeutic activities;Patient/family education   PT Goals (Current goals can be found in the Care Plan section) Acute Rehab PT Goals Patient Stated Goal: To walk, get stronger PT Goal Formulation: With patient/family Time For Goal Achievement: 01/22/16 Potential to Achieve Goals: Good    Frequency Min 3X/week   Barriers to discharge        Co-evaluation               End of Session Equipment Utilized During Treatment: Gait belt;Other (comment) (sling L UE) Activity Tolerance: Patient limited by pain;Patient limited by fatigue Patient left: in bed;with call bell/phone within reach;with family/visitor present           Time: 1610-9604 PT Time Calculation (min) (ACUTE ONLY): 25 min   Charges:   PT Evaluation $PT Eval Moderate Complexity: 1 Procedure PT Treatments $Gait Training: 8-22 mins   PT G Codes:        Elray Mcgregor January 24, 2016, 5:19 PM Sheran Lawless, PT [phone] 2016/01/24

## 2016-01-15 NOTE — Care Management Note (Signed)
Case Management Note  Patient Details  Name: Diana Braun MRN: [id number] Date of Birth: 11/15/1922  Subjective/Objective:       Syncope, bradycardia             Action/Plan: Discharge Planning: NCM spoke to pt's dtr, Terrilyn Saverlvira Parrott # [phone], states pt lives in home with Darden Amberdtr, Minda Penson # [phone]. States pt has RW and bedside commode at home. She has a wheelchair but it is too heavy to transport and put in car. Dtr requesting light manual wheelchair. Attending made aware, waiting for signed order. Will order once order complete. [facility] Pt has RN/PT. Attending made aware and waiting signed orders.   PCP - Emogene MorganAYCOCK, NGWE A MD  Expected Discharge Date:                  Expected Discharge Plan:  Home w Home Health Services  In-House Referral:  NA  Discharge planning Services  CM Consult  Post Acute Care Choice:  Resumption of Svcs/PTA Provider, Home Health Choice offered to:  Adult Children  DME Arranged:  Community education officerLightweight manual wheelchair with seat cushion DME Agency:  [facility]  HH Arranged:  RN, [facility]  [facility]  Status of Service:  In process, will continue to follow  If discussed at Long Length of Stay Meetings, dates discussed:    Additional Comments:  Elliot CousinShavis, Anavi Branscum Ellen, RN 01/15/2016, 3:57 PM

## 2016-01-15 NOTE — Progress Notes (Signed)
Subjective: Patient is alert and oriented but does not speak [place]. Her daughter is in the room and states that she continues to have slurred speech. She was able to follow commands and per daughter's able to make clear conversation through her dysarthria.  Objective: Current vital signs: BP (!) 128/53   Pulse 73   Temp 98.3 F (36.8 C) (Oral)   Resp (!) 23   Ht 4\' 11"  (1.499 m)   Wt 55.2 kg (121 lb 11.1 oz)   SpO2 100%   BMI 24.58 kg/m  Vital signs in last 24 hours: Temp:  [97.8 F (36.6 C)-98.3 F (36.8 C)] 98.3 F (36.8 C) (08/29 0800) Pulse Rate:  [56-107] 73 (08/29 0900) Resp:  [13-30] 23 (08/29 0900) BP: (95-144)/(34-81) 128/53 (08/29 0900) SpO2:  [79 %-100 %] 100 % (08/29 0900) Weight:  [55.2 kg (121 lb 11.1 oz)] 55.2 kg (121 lb 11.1 oz) (08/29 0300)  Intake/Output from previous day: 08/28 0701 - 08/29 0700 In: 1070 [P.O.:530; I.V.:540] Out: 3260 [Urine:3260] Intake/Output this shift: No intake/output data recorded. Nutritional status: Diet NPO time specified Except for: Sips with Meds     Neurologic Exam: General: NAD Mental Status: Alert, oriented to hospital and daughter along with month,   Speech dysarthric without evidence of aphasia.  Able to follow 3 step commands without difficulty. Cranial Nerves: II:  Visual fields grossly normal, pupils equal, round, reactive to light and accommodation III,IV, VI: ptosis not present, extra-ocular motions intact bilaterally V,VII: smile symmetric, facial light touch sensation normal bilaterally VIII: hearing normal bilaterally IX,X: uvula rises symmetrically XI: bilateral shoulder shrug XII: midline tongue extension without atrophy or fasciculations  Motor: Right : Upper extremity   5/5    Left:     Upper extremity   5/5  Lower extremity   5/5     Lower extremity   5/5 Tone and bulk:normal tone throughout; no atrophy noted Sensory: Pinprick and light touch intact throughout, bilaterally Deep Tendon Reflexes:   Right: Upper Extremity   Left: Upper extremity   biceps (C-5 to C-6) 2/4   biceps (C-5 to C-6) 2/4 tricep (C7) 2/4    triceps (C7) 2/4 Brachioradialis (C6) 2/4  Brachioradialis (C6) 2/4  Lower Extremity Lower Extremity  quadriceps (L-2 to L-4) 2/4   quadriceps (L-2 to L-4) 2/4 Achilles (S1) 2/4   Achilles (S1) 2/4  Plantars: Right: downgoing   Left: downgoing Cerebellar: normal finger-to-nose,  normal heel-to-shin test   Lab Results: Basic Metabolic Panel:  Recent Labs Lab 01/13/16 1549  01/13/16 1700 01/13/16 2359 01/14/16 0548 01/15/16 0248  NA  --   --  130* 133* 135 135  K  --   --  4.4 4.9 4.4 4.5  CL  --   --  98* 100* 104 104  CO2  --   --  21* 25 24 25   GLUCOSE  --   --  257* 159* 99 98  BUN  --   --  53* 42* 37* 35*  CREATININE  --   --  1.26* 1.29* 1.17* 1.11*  CALCIUM  --   < > 8.4* 8.7* 8.3* 8.7*  MG 1.8  --   --  1.5* 1.5* 2.2  PHOS  --   --   --  3.9 3.6 4.1  < > = values in this interval not displayed.  Liver Function Tests:  Recent Labs Lab 01/13/16 1700 01/13/16 2359 01/15/16 0248  AST 141* 106* 52*  ALT 146* 143* 91*  ALKPHOS  49 48 45  BILITOT 0.6 1.0 0.7  PROT 6.7 7.3 6.6  ALBUMIN 3.3* 3.4* 3.1*   No results for input(s): LIPASE, AMYLASE in the last 168 hours. No results for input(s): AMMONIA in the last 168 hours.  CBC:  Recent Labs Lab 01/13/16 1549 01/13/16 2359 01/14/16 0548 01/15/16 0248  WBC 18.9* 15.9* 11.1* 11.0*  NEUTROABS 15.2*  --   --   --   HGB 12.2 11.6* 9.9* 10.1*  HCT 36.1 35.5* 30.3* 31.5*  MCV 96.1 96.5 96.5 97.5  PLT 293 259 234 192    Cardiac Enzymes:  Recent Labs Lab 01/13/16 1549 01/13/16 2359 01/14/16 0548 01/14/16 1224  TROPONINI 0.03* 0.04* 0.05* 0.04*    Lipid Panel:  Recent Labs Lab 01/14/16 1224  CHOL 122  TRIG 86  HDL 48  CHOLHDL 2.5  VLDL 17  LDLCALC 57    CBG:  Recent Labs Lab 01/14/16 0746 01/14/16 1215 01/14/16 1731 01/14/16 2113 01/15/16 0749  GLUCAP 88 92  125* 112* 92    Microbiology: Results for orders placed or performed during the hospital encounter of 01/13/16  MRSA PCR Screening     Status: None   Collection Time: 01/13/16  8:12 PM  Result Value Ref Range Status   MRSA by PCR NEGATIVE NEGATIVE Final    Comment:        The GeneXpert MRSA Assay (FDA approved for NASAL specimens only), is one component of a comprehensive MRSA colonization surveillance program. It is not intended to diagnose MRSA infection nor to guide or monitor treatment for MRSA infections.     Coagulation Studies:  Recent Labs  01/13/16 2359  LABPROT 14.6  INR 1.14    Imaging: Dg Chest 1 View  Result Date: 01/13/2016 CLINICAL DATA:  Heart rate elevated EXAM: CHEST 1 VIEW COMPARISON:  01/04/2016 FINDINGS: Aortic atherosclerosis. Heart size is normal. Chronic interstitial coarsening is noted bilaterally No pleural effusion or edema. No airspace consolidation. Chronic left lateral rib fracture deformities noted. IMPRESSION: 1. Chronic lung disease without superimposed acute cardiopulmonary abnormality. 2. Aortic atherosclerosis. Electronically Signed   By: Signa Kell M.D.   On: 01/13/2016 16:15   Ct Head Wo Contrast  Result Date: 01/13/2016 CLINICAL DATA:  Seizures. EXAM: CT HEAD WITHOUT CONTRAST TECHNIQUE: Contiguous axial images were obtained from the base of the skull through the vertex without intravenous contrast. COMPARISON:  01/04/2016 FINDINGS: Brain: There is low attenuation within the subcortical and periventricular white matter compatible with chronic microvascular disease. 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 There is no abnormal extra-axial fluid collection, intracranial hemorrhage or mass. No evidence for acute brain infarct. Vascular: No hyperdense vessel or unexpected calcification. Skull: The osseous skull is intact. Sinuses/Orbits: Mucosal thickening involving the left maxillary sinus. The remaining paranasal  sinuses and the mastoid air cells are clear. Other: Gas is identified within the soft tissues overlying the right side of face. IMPRESSION: 1. No acute intracranial abnormalities. 2. Chronic microvascular disease Electronically Signed   By: Signa Kell M.D.   On: 01/13/2016 16:59   Ct Angio Neck W Or Wo Contrast  Result Date: 01/15/2016 CLINICAL DATA:  Dysarthria. Recent seizure like activity in the setting of cardiac arrhythmia. EXAM: CT ANGIOGRAPHY NECK TECHNIQUE: Multidetector CT imaging of the neck was performed using the standard protocol during bolus administration of intravenous contrast. Multiplanar CT image reconstructions and MIPs were obtained to evaluate the vascular anatomy. Carotid stenosis measurements (when applicable) are obtained utilizing NASCET criteria, using the distal internal  carotid diameter as the denominator. CONTRAST:  50 mL Isovue 370 COMPARISON:  01/04/2016 FINDINGS: Aortic arch: 3 vessel aortic arch with moderate atherosclerotic plaque. Atherosclerotic luminal irregularity involving the subclavian arteries without significant stenosis. Right carotid system: Atherosclerotic plaque at the carotid bifurcation without stenosis, unchanged. A 4 mm soft tissue density projecting into the posterior aspect of the proximal ICA lumen is unchanged. Left carotid system: Mild atherosclerotic plaque about the carotid bifurcation without stenosis, unchanged. Vertebral arteries: Patent vertebral arteries without evidence of stenosis. The right vertebral artery is slightly larger than the left. Skeleton: Mild multilevel cervical spondylosis. Moderate left-sided cervical facet arthrosis. Other neck: Right-sided facial hematoma has decreased in density and slightly decreased in size, now measuring 4.0 x 3.1 cm. Upper chest: Minimal dependent atelectasis in the upper lobes. IMPRESSION: 1. Atherosclerosis without significant stenosis. 2. Unchanged soft tissue density projecting into the proximal  right ICA which could represent focal soft plaque/adherent thrombus, small intimal flap, or web. 3. Evolving right facial hematoma. Electronically Signed   By: Sebastian Ache M.D.   On: 01/15/2016 07:49    Medications:  Prior to Admission:  Prescriptions Prior to Admission  Medication Sig Dispense Refill Last Dose  . acetaminophen (TYLENOL) 500 MG tablet Take 500 mg by mouth every 6 (six) hours as needed (pain).     Marland Kitchen levothyroxine (SYNTHROID, LEVOTHROID) 50 MCG tablet Take 50 mcg by mouth daily.   unknown  . metoprolol tartrate (LOPRESSOR) 25 MG tablet Take 1 tablet (25 mg total) by mouth 2 (two) times daily. 60 tablet 0 unknown  . triamcinolone cream (KENALOG) 0.1 % Apply 1 application topically 2 (two) times daily as needed (rash).      Scheduled: .  ceFAZolin (ANCEF) IV  2 g Intravenous On Call  . gentamicin irrigation  80 mg Irrigation On Call  . heparin  5,000 Units Subcutaneous Q8H  . insulin aspart  0-5 Units Subcutaneous QHS  . insulin aspart  0-9 Units Subcutaneous TID WC  . levothyroxine  50 mcg Oral QAC breakfast  . mouth rinse  15 mL Mouth Rinse BID  . pantoprazole  40 mg Oral Daily    Assessment/Plan: Assessment and Plan:  1. Possible seizure: She was noted to have what is described as generalized tonic clonic activity in the emergency department. However, this was all associated with arrhythmias on telemetry, including asystole and heart block. Given that these episodes appear to been provoked by arrhythmia and subsequent hypoperfusion of the brain, I do not think antiepileptic medication is empirically indicated. I would proceed with correction of her arrhythmia to see if this eradicate these episodes. EEG was obtained and did not show any epileptiform activity. At this time will not start an antiepileptic medication.   2. Dysarthria: By reports this is a new development. This is concerning for possible ischemic infarction in the setting of arrhythmia. No obvious acute  ischemia was noted on her CT scan though this does not exclude this diagnosis. She was noted to have a small luminal irregularity in the proximal right internal carotid artery on a CT angiography of the neck on 01/04/16. This was not occlusive and its significance was unclear, with the radiologist interpreting it is possible coronary atherosclerosis or a nonocclusive web with consideration of a small dissection in the setting of her recent fall. This will need to be reexamined and I will repeat CT angiogram of the neck. Echocardiogram is pending. Recent hemoglobin A1c was 6.2 on 12/24/15. LDL 57. She would likely benefit  from aspirin 81 mg daily if no contra indication from a cardiac perspective.  At this time given her continued dysarthria we'll repeat CT of brain to evaluate for a evolve stroke.      Felicie Morn PA-C [facility] [phone]  01/15/2016, 10:33 AM   Neurology Attending Addendum:  This patient was seen, examined, and d/w PA. I have reviewed the note and agree with the findings, assessment and plan as documented with the following additions.   She is not had any further seizure-like episodes. She remains dysarthric. No new complaints.  Elemental neurologic examination is notable for moderate dysarthria without other focal findings.  CT angiogram of the neck from 01/15/16 has been personally reviewed. There is a focal filling defect in the proximal right internal carotid artery that is unchanged from the previous scan on 01/04/16. There is no significant stenosis associated with this lesion.  Impression: 1. Possible seizure 2. Dysarthria  Recommendations: As per PA note. Do not suspect her seizures are epileptic in origin and so would defer antiepileptic treatment for now. Repeat CT of the head to evaluate for possible infarction given her dysarthria.

## 2016-01-16 ENCOUNTER — Encounter (HOSPITAL_COMMUNITY): Payer: Self-pay

## 2016-01-16 ENCOUNTER — Inpatient Hospital Stay (HOSPITAL_COMMUNITY): Payer: Medicare Other

## 2016-01-16 DIAGNOSIS — I480 Paroxysmal atrial fibrillation: Secondary | ICD-10-CM

## 2016-01-16 DIAGNOSIS — E871 Hypo-osmolality and hyponatremia: Secondary | ICD-10-CM

## 2016-01-16 LAB — COMPREHENSIVE METABOLIC PANEL
ALK PHOS: 44 U/L (ref 38–126)
ALT: 51 U/L (ref 14–54)
ANION GAP: 7 (ref 5–15)
AST: 32 U/L (ref 15–41)
Albumin: 3.1 g/dL — ABNORMAL LOW (ref 3.5–5.0)
BILIRUBIN TOTAL: 0.5 mg/dL (ref 0.3–1.2)
BUN: 29 mg/dL — ABNORMAL HIGH (ref 6–20)
CALCIUM: 8.5 mg/dL — AB (ref 8.9–10.3)
CO2: 23 mmol/L (ref 22–32)
Chloride: 103 mmol/L (ref 101–111)
Creatinine, Ser: 1.23 mg/dL — ABNORMAL HIGH (ref 0.44–1.00)
GFR, EST AFRICAN AMERICAN: 43 mL/min — AB (ref >60)
GFR, EST NON AFRICAN AMERICAN: 37 mL/min — AB (ref >60)
GLUCOSE: 114 mg/dL — AB (ref 65–99)
Potassium: 3.9 mmol/L (ref 3.5–5.1)
Sodium: 133 mmol/L — ABNORMAL LOW (ref 135–145)
TOTAL PROTEIN: 6.4 g/dL — AB (ref 6.5–8.1)

## 2016-01-16 LAB — CBC
HCT: 30 % — ABNORMAL LOW (ref 36.0–46.0)
HEMOGLOBIN: 9.8 g/dL — AB (ref 12.0–15.0)
MCH: 31.5 pg (ref 26.0–34.0)
MCHC: 32.7 g/dL (ref 30.0–36.0)
MCV: 96.5 fL (ref 78.0–100.0)
Platelets: 202 10*3/uL (ref 150–400)
RBC: 3.11 MIL/uL — AB (ref 3.87–5.11)
RDW: 14.6 % (ref 11.5–15.5)
WBC: 14.3 10*3/uL — AB (ref 4.0–10.5)

## 2016-01-16 LAB — GLUCOSE, CAPILLARY
GLUCOSE-CAPILLARY: 152 mg/dL — AB (ref 65–99)
GLUCOSE-CAPILLARY: 149 mg/dL — AB (ref 65–99)
Glucose-Capillary: 131 mg/dL — ABNORMAL HIGH (ref 65–99)
Glucose-Capillary: 131 mg/dL — ABNORMAL HIGH (ref 65–99)

## 2016-01-16 LAB — MAGNESIUM: MAGNESIUM: 1.6 mg/dL — AB (ref 1.7–2.4)

## 2016-01-16 LAB — PHOSPHORUS: PHOSPHORUS: 3.2 mg/dL (ref 2.5–4.6)

## 2016-01-16 MED ORDER — FAMOTIDINE IN NACL 20-0.9 MG/50ML-% IV SOLN: 20.0000 mg | INTRAVENOUS | Status: DC

## 2016-01-16 MED ORDER — FAMOTIDINE IN NACL 20-0.9 MG/50ML-% IV SOLN
20.0000 mg | Freq: Two times a day (BID) | INTRAVENOUS | Status: DC
  Administered 2016-01-16: 20 mg via INTRAVENOUS
  Filled 2016-01-16: qty 50

## 2016-01-16 MED ORDER — FAMOTIDINE 20 MG PO TABS
20.0000 mg | ORAL_TABLET | Freq: Every day | ORAL | Status: DC
Start: 2016-01-17 — End: 2016-01-19
  Administered 2016-01-17 – 2016-01-19 (×3): 20 mg via ORAL
  Filled 2016-01-16 (×3): qty 1

## 2016-01-16 MED ORDER — FAMOTIDINE 20 MG PO TABS: 20.0000 mg | ORAL_TABLET | Freq: Two times a day (BID) | ORAL | Status: DC

## 2016-01-16 MED ORDER — SODIUM CHLORIDE 0.9 % IV SOLN
INTRAVENOUS | Status: DC
  Administered 2016-01-16 – 2016-01-17 (×3): via INTRAVENOUS

## 2016-01-16 MED ORDER — MECLIZINE HCL 25 MG PO TABS
12.5000 mg | ORAL_TABLET | Freq: Three times a day (TID) | ORAL | Status: DC | PRN
  Administered 2016-01-16 – 2016-01-17 (×2): 12.5 mg via ORAL
  Filled 2016-01-16 (×2): qty 1

## 2016-01-16 MED ORDER — MAGNESIUM SULFATE 2 GM/50ML IV SOLN
2.0000 g | Freq: Once | INTRAVENOUS | Status: AC
  Administered 2016-01-16: 2 g via INTRAVENOUS
  Filled 2016-01-16: qty 50

## 2016-01-16 NOTE — Progress Notes (Signed)
SUBJECTIVE: C/o dizziness, she states like things are moving, daughter at bedside remarks this has been symptom for her previously out-pt as well, no recurrent syncope.  . famotidine (PEPCID) IV  20 mg Intravenous Q12H  . heparin  5,000 Units Subcutaneous Q8H  . insulin aspart  0-5 Units Subcutaneous QHS  . insulin aspart  0-9 Units Subcutaneous TID WC  . levothyroxine  50 mcg Oral QAC breakfast  . magnesium sulfate 1 - 4 g bolus IVPB  2 g Intravenous Once  . mouth rinse  15 mL Mouth Rinse BID  . pantoprazole  40 mg Oral Daily   . sodium chloride 100 mL/hr at 01/16/16 0831    OBJECTIVE: Physical Exam: Vitals:   01/15/16 1353 01/15/16 1408 01/15/16 2045 01/16/16 0452  BP: (!) 125/53 (!) 128/54 101/65 (!) 118/44  Pulse: 73 72 76 76  Resp: 13 16 16 16   Temp:   99.6 F (37.6 C) 98.9 F (37.2 C)  TempSrc:   Oral Oral  SpO2:   98% 99%  Weight:    114 lb 11.2 oz (52 kg)  Height:        Intake/Output Summary (Last 24 hours) at 01/16/16 3976 Last data filed at 01/16/16 0456  Gross per 24 hour  Intake           558.33 ml  Output              550 ml  Net             8.33 ml    Telemetry reveals sinus rhythm with intermittent V pacing  GEN- The patient is appears in NAD, c/o feeling dizzy   Head- normocephalic, facial hematoma slowly improving Eyes-  Sclera clear, conjunctiva pink Ears- hearing intact Oropharynx- clear Neck- supple, no JVP Lymph- no cervical lymphadenopathy Lungs- Clear to ausculation bilaterally, normal work of breathing Heart- Regular rate and rhythm, no significant murmurs, rubs or gallops, PMI not laterally displaced GI- soft, NT, ND Extremities- no clubbing, cyanosis, or edema Skin- no rash or lesion Psych- euthymic mood, full affect Neuro- strength and sensation are intact  PPM site is dry, small hematoma  LABS: Basic Metabolic Panel:  Recent Labs  01/15/16 0248 01/16/16 0334  NA 135 133*  K 4.5 3.9  CL 104 103  CO2 25 23  GLUCOSE 98  114*  BUN 35* 29*  CREATININE 1.11* 1.23*  CALCIUM 8.7* 8.5*  MG 2.2 1.6*  PHOS 4.1 3.2   Liver Function Tests:  Recent Labs  01/15/16 0248 01/16/16 0334  AST 52* 32  ALT 91* 51  ALKPHOS 45 44  BILITOT 0.7 0.5  PROT 6.6 6.4*  ALBUMIN 3.1* 3.1*   No results for input(s): LIPASE, AMYLASE in the last 72 hours. CBC:  Recent Labs  01/13/16 1549  01/15/16 0248 01/16/16 0334  WBC 18.9*  < > 11.0* 14.3*  NEUTROABS 15.2*  --   --   --   HGB 12.2  < > 10.1* 9.8*  HCT 36.1  < > 31.5* 30.0*  MCV 96.1  < > 97.5 96.5  PLT 293  < > 192 202  < > = values in this interval not displayed. Cardiac Enzymes:  Recent Labs  01/13/16 2359 01/14/16 0548 01/14/16 1224  TROPONINI 0.04* 0.05* 0.04*   Fasting Lipid Panel:  Recent Labs  01/14/16 1224  CHOL 122  HDL 48  LDLCALC 57  TRIG 86  CHOLHDL 2.5   Thyroid Function Tests:  Recent Labs  01/13/16 2359  TSH 0.705    RADIOLOGY:  Dg Chest 2 View Result Date: 01/16/2016 CLINICAL DATA:  82 year old female with history of recent pacemaker placement. Arm soreness today. Cough. EXAM: CHEST  2 VIEW COMPARISON:  Chest x-ray 12/06/2015. FINDINGS: Left-sided pacemaker device in place with lead tips projecting over the expected location of the right atrium and right ventricular apex. Lung volumes are normal. Mild diffuse coarse interstitial markings appear to be chronic and are similar to prior examinations. No acute consolidative airspace disease. No pleural effusions. No pneumothorax. No definite suspicious appearing pulmonary nodules or masses. No evidence of pulmonary edema. Heart size is mildly enlarged. Upper mediastinal contours are within normal limits. Aortic atherosclerosis. IMPRESSION: 1. Interval placement of new left-sided pacemaker device which appears appropriately located. No pneumothorax or other acute complicating features. 2. Mild cardiomegaly. 3. Aortic atherosclerosis. Electronically Signed   By: Vinnie Langton M.D.    On: 01/16/2016 07:19    Ct Angio Neck W Or Wo Contrast Result Date: 01/15/2016 CLINICAL DATA:  Dysarthria. Recent seizure like activity in the setting of cardiac arrhythmia. EXAM: CT ANGIOGRAPHY NECK TECHNIQUE: Multidetector CT imaging of the neck was performed using the Braun protocol during bolus administration of intravenous contrast. Multiplanar CT image reconstructions and MIPs were obtained to evaluate the vascular anatomy. Carotid stenosis measurements (when applicable) are obtained utilizing NASCET criteria, using the distal internal carotid diameter as the denominator. CONTRAST:  50 mL Isovue 370 COMPARISON:  01/04/2016 FINDINGS: Aortic arch: 3 vessel aortic arch with moderate atherosclerotic plaque. Atherosclerotic luminal irregularity involving the subclavian arteries without significant stenosis. Right carotid system: Atherosclerotic plaque at the carotid bifurcation without stenosis, unchanged. A 4 mm soft tissue density projecting into the posterior aspect of the proximal ICA lumen is unchanged. Left carotid system: Mild atherosclerotic plaque about the carotid bifurcation without stenosis, unchanged. Vertebral arteries: Patent vertebral arteries without evidence of stenosis. The right vertebral artery is slightly larger than the left. Skeleton: Mild multilevel cervical spondylosis. Moderate left-sided cervical facet arthrosis. Other neck: Right-sided facial hematoma has decreased in density and slightly decreased in size, now measuring 4.0 x 3.1 cm. Upper chest: Minimal dependent atelectasis in the upper lobes. IMPRESSION: 1. Atherosclerosis without significant stenosis. 2. Unchanged soft tissue density projecting into the proximal right ICA which could represent focal soft plaque/adherent thrombus, small intimal flap, or web. 3. Evolving right facial hematoma. Electronically Signed   By: Logan Bores M.D.   On: 01/15/2016 07:49       ASSESSMENT AND PLAN:  1. Syncope     Witnessed  events in ED, ICU correlated with CHB/pauses, asystolic events     S/p temp wire R groin >> removed post PPM implant yesterday     PPM (SJM)  implant yesterday with Dr. Curt Bears, device check this morning with normal function, CXR without pneumothorax     Site has small hematoma, no bleeding, mild tenderness     monitor hematoma without intervention at this time  2. HTN     BP stable  3. PAFib     CHA2DS2Vasc is at least 5, not currently on a/c     S/p fall with significant facial trauma, hematoma few weeks back, evidence of this remains     She remains in SR currently     D/w Dr. Curt Bears, recommend holding a/c at this time given facial trauma/hematoma and falls at home, once these issues are felt to be stable/resolved, would revisit chronic anticoagulation  4. leukocytosis  WBC 18.9  >> 15.9 >> 11.1 >> 14.3     Recently started on steroid 01/04/16 at ER visit for elevated ESR     afebrile     Deferred to IM  5. C/o dizziness "moving" and nausea     Neurology on case     IM at bedside, plans for hydration and repeat CT head    EP service remains available, please recall if needed, wound check and out patient f/u have been arranged   Baldwin Jamaica, MD 01/16/2016 9:04 AM  I have seen and examined this patient with Diana Braun.  Agree with above, note added to reflect my findings.  On exam, regular rhythm, no murmurs, lungs clear.  Has intermittent pacing on device interrogation.  Plan for follow up in device clinic in 10 days.  Continues to complain of dizziness, feels like room spinning.  Agree with repeat CT of head.    Shaquana Buel M. Brennden Masten MD 01/16/2016 1:58 PM

## 2016-01-16 NOTE — Progress Notes (Signed)
PROGRESS NOTE    Diana Braun  ZOX:[id number] DOB: Sep 04, 1922 DOA: 01/13/2016 PCP: Emogene Morgan, MD  Brief Narrative: 82 year old female with PMH as below, which is significant for DM (now off meds), HTN, hypothyroidism, and HLD. She is from the [place] and has only been in the [place] for a few months. She has already been hospitalized once 8/7-8/9. Chief complaint at that time was dizziness and syncope. She was found to have UTI secondary to GBS and was treated with amoxicillin. Symptoms of orthostasis, dizziness, and syncope were attributed to UTI.   She was discharged to home with home health, however, she presented to ED again 8/18 with complaint of syncope and shaking during LOC. Workup was negative and she was discharged home with cardiology and neurology follow up.  8/27 she again presented to ED at [facility] for syncope with "seizure-like" activity and nausea. Upon arrival to the ED she was actively seizing and vomiting. Very brief duration.  Treated with Keppra. On telemetry seizures would coincide with long pauses. Patient had several different arrythmias in ED including ST, AF, "escape rhythm" and CHB. She was transferred to [facility] for cardiology evaluation and pacing.    Assessment & Plan:   Active Problems:   A-fib (HCC)   Syncope and collapse   Heart block   Hyponatremia   Hypothyroidism   Convulsions (HCC)   Dysarthria  Syncope, CHB, asystolic events  S/P pacemaker.  Stable.   Dizziness; IV fluids. Repeat CT head. PRN meclizine, Pepcid for reflux, and nausea.   Hyponatremia; IV fluids.   Leukocytosis, no clear source or concern for infection besides WBC Defer ABX for now Culture blood  Hypothyroidism; continue with synthroid.   Possible Seizure:  Per neurology no need for AED, patients spell might be related to arrhythmia.   P. A fib cardiology following.  No anticoagulation at this time due to recent fall, facial hematoma. Follow up out patient to  consider start anticoagulation if no further falls.    Dysarthria; improved. Ct head negative for stroke.   DVT prophylaxis: lovenox.  Code Status:  Full code.  Family Communication: daughter at bedside.  Disposition Plan: home in 24 hours.    Consultants:   Cardiology  CCM   Procedures:   ECHO; normal EF.   Antimicrobials:  none  Subjective: Feeling dizzy, nauseous , speech improved per daughter   Objective: Vitals:   01/15/16 1353 01/15/16 1408 01/15/16 2045 01/16/16 0452  BP: (!) 125/53 (!) 128/54 101/65 (!) 118/44  Pulse: 73 72 76 76  Resp: 13 16 16 16   Temp:   99.6 F (37.6 C) 98.9 F (37.2 C)  TempSrc:   Oral Oral  SpO2:   98% 99%  Weight:    52 kg (114 lb 11.2 oz)  Height:        Intake/Output Summary (Last 24 hours) at 01/16/16 0801 Last data filed at 01/16/16 0456  Gross per 24 hour  Intake           558.33 ml  Output              550 ml  Net             8.33 ml   Filed Weights   01/14/16 0600 01/15/16 0300 01/16/16 0452  Weight: 53.8 kg (118 lb 9.7 oz) 55.2 kg (121 lb 11.1 oz) 52 kg (114 lb 11.2 oz)    Examination:  General exam: Appears calm and comfortable  Respiratory system:  Clear to auscultation. Respiratory effort normal. Cardiovascular system: S1 & S2 heard, RRR. No JVD, murmurs, rubs, gallops or clicks. No pedal edema. Gastrointestinal system: Abdomen is nondistended, soft and nontender. No organomegaly or masses felt. Normal bowel sounds heard. Central nervous system: Alert and oriented. No focal neurological deficits. Extremities: Symmetric 5 x 5 power. Skin: No rashes, lesions or ulcers Psychiatry: Judgement and insight appear normal. Mood & affect appropriate.     Data Reviewed: I have personally reviewed following labs and imaging studies  CBC:  Recent Labs Lab 01/13/16 1549 01/13/16 2359 01/14/16 0548 01/15/16 0248 01/16/16 0334  WBC 18.9* 15.9* 11.1* 11.0* 14.3*  NEUTROABS 15.2*  --   --   --   --   HGB 12.2  11.6* 9.9* 10.1* 9.8*  HCT 36.1 35.5* 30.3* 31.5* 30.0*  MCV 96.1 96.5 96.5 97.5 96.5  PLT 293 259 234 192 202   Basic Metabolic Panel:  Recent Labs Lab 01/13/16 1549 01/13/16 1700 01/13/16 2359 01/14/16 0548 01/15/16 0248 01/16/16 0334  NA  --  130* 133* 135 135 133*  K  --  4.4 4.9 4.4 4.5 3.9  CL  --  98* 100* 104 104 103  CO2  --  21* 25 24 25 23   GLUCOSE  --  257* 159* 99 98 114*  BUN  --  53* 42* 37* 35* 29*  CREATININE  --  1.26* 1.29* 1.17* 1.11* 1.23*  CALCIUM  --  8.4* 8.7* 8.3* 8.7* 8.5*  MG 1.8  --  1.5* 1.5* 2.2 1.6*  PHOS  --   --  3.9 3.6 4.1 3.2   GFR: Estimated Creatinine Clearance: 22 mL/min (by C-G formula based on SCr of 1.23 mg/dL). Liver Function Tests:  Recent Labs Lab 01/13/16 1700 01/13/16 2359 01/15/16 0248 01/16/16 0334  AST 141* 106* 52* 32  ALT 146* 143* 91* 51  ALKPHOS 49 48 45 44  BILITOT 0.6 1.0 0.7 0.5  PROT 6.7 7.3 6.6 6.4*  ALBUMIN 3.3* 3.4* 3.1* 3.1*   No results for input(s): LIPASE, AMYLASE in the last 168 hours. No results for input(s): AMMONIA in the last 168 hours. Coagulation Profile:  Recent Labs Lab 01/13/16 2359  INR 1.14   Cardiac Enzymes:  Recent Labs Lab 01/13/16 1549 01/13/16 2359 01/14/16 0548 01/14/16 1224  TROPONINI 0.03* 0.04* 0.05* 0.04*   BNP (last 3 results) No results for input(s): PROBNP in the last 8760 hours. HbA1C: No results for input(s): HGBA1C in the last 72 hours. CBG:  Recent Labs Lab 01/14/16 2113 01/15/16 0749 01/15/16 1622 01/15/16 2051 01/16/16 0733  GLUCAP 112* 92 152* 112* 131*   Lipid Profile:  Recent Labs  01/14/16 1224  CHOL 122  HDL 48  LDLCALC 57  TRIG 86  CHOLHDL 2.5   Thyroid Function Tests:  Recent Labs  01/13/16 2359  TSH 0.705   Anemia Panel: No results for input(s): VITAMINB12, FOLATE, FERRITIN, TIBC, IRON, RETICCTPCT in the last 72 hours. Sepsis Labs: No results for input(s): PROCALCITON, LATICACIDVEN in the last 168 hours.  Recent  Results (from the past 240 hour(s))  MRSA PCR Screening     Status: None   Collection Time: 01/13/16  8:12 PM  Result Value Ref Range Status   MRSA by PCR NEGATIVE NEGATIVE Final    Comment:        The GeneXpert MRSA Assay (FDA approved for NASAL specimens only), is one component of a comprehensive MRSA colonization surveillance program. It is not intended to diagnose MRSA  infection nor to guide or monitor treatment for MRSA infections.   Culture, blood (routine x 2)     Status: None (Preliminary result)   Collection Time: 01/13/16 11:59 PM  Result Value Ref Range Status   Specimen Description BLOOD LEFT ANTECUBITAL  Final   Special Requests BOTTLES DRAWN AEROBIC ONLY 5CC  Final   Culture NO GROWTH 1 DAY  Final   Report Status PENDING  Incomplete  Culture, blood (routine x 2)     Status: None (Preliminary result)   Collection Time: 01/14/16 12:08 AM  Result Value Ref Range Status   Specimen Description BLOOD LEFT HAND  Final   Special Requests IN PEDIATRIC BOTTLE 4CC  Final   Culture NO GROWTH 1 DAY  Final   Report Status PENDING  Incomplete         Radiology Studies: Dg Chest 2 View  Result Date: 01/16/2016 CLINICAL DATA:  82 year old female with history of recent pacemaker placement. Arm soreness today. Cough. EXAM: CHEST  2 VIEW COMPARISON:  Chest x-ray 12/06/2015. FINDINGS: Left-sided pacemaker device in place with lead tips projecting over the expected location of the right atrium and right ventricular apex. Lung volumes are normal. Mild diffuse coarse interstitial markings appear to be chronic and are similar to prior examinations. No acute consolidative airspace disease. No pleural effusions. No pneumothorax. No definite suspicious appearing pulmonary nodules or masses. No evidence of pulmonary edema. Heart size is mildly enlarged. Upper mediastinal contours are within normal limits. Aortic atherosclerosis. IMPRESSION: 1. Interval placement of new left-sided pacemaker  device which appears appropriately located. No pneumothorax or other acute complicating features. 2. Mild cardiomegaly. 3. Aortic atherosclerosis. Electronically Signed   By: Trudie Reed M.D.   On: 01/16/2016 07:19   Ct Angio Neck W Or Wo Contrast  Result Date: 01/15/2016 CLINICAL DATA:  Dysarthria. Recent seizure like activity in the setting of cardiac arrhythmia. EXAM: CT ANGIOGRAPHY NECK TECHNIQUE: Multidetector CT imaging of the neck was performed using the standard protocol during bolus administration of intravenous contrast. Multiplanar CT image reconstructions and MIPs were obtained to evaluate the vascular anatomy. Carotid stenosis measurements (when applicable) are obtained utilizing NASCET criteria, using the distal internal carotid diameter as the denominator. CONTRAST:  50 mL Isovue 370 COMPARISON:  01/04/2016 FINDINGS: Aortic arch: 3 vessel aortic arch with moderate atherosclerotic plaque. Atherosclerotic luminal irregularity involving the subclavian arteries without significant stenosis. Right carotid system: Atherosclerotic plaque at the carotid bifurcation without stenosis, unchanged. A 4 mm soft tissue density projecting into the posterior aspect of the proximal ICA lumen is unchanged. Left carotid system: Mild atherosclerotic plaque about the carotid bifurcation without stenosis, unchanged. Vertebral arteries: Patent vertebral arteries without evidence of stenosis. The right vertebral artery is slightly larger than the left. Skeleton: Mild multilevel cervical spondylosis. Moderate left-sided cervical facet arthrosis. Other neck: Right-sided facial hematoma has decreased in density and slightly decreased in size, now measuring 4.0 x 3.1 cm. Upper chest: Minimal dependent atelectasis in the upper lobes. IMPRESSION: 1. Atherosclerosis without significant stenosis. 2. Unchanged soft tissue density projecting into the proximal right ICA which could represent focal soft plaque/adherent thrombus,  small intimal flap, or web. 3. Evolving right facial hematoma. Electronically Signed   By: Sebastian Ache M.D.   On: 01/15/2016 07:49        Scheduled Meds: . heparin  5,000 Units Subcutaneous Q8H  . insulin aspart  0-5 Units Subcutaneous QHS  . insulin aspart  0-9 Units Subcutaneous TID WC  . levothyroxine  50  mcg Oral QAC breakfast  . mouth rinse  15 mL Mouth Rinse BID  . pantoprazole  40 mg Oral Daily   Continuous Infusions:    LOS: 3 days    Time spent: 35 Minutes.     Alba Cory, MD [facility] Pager [phone]  If 7PM-7AM, please contact night-coverage [email or web address] Password [email or web address] 01/16/2016, 8:01 AM

## 2016-01-16 NOTE — Progress Notes (Signed)
Subjective: Patient is in bed comfortably. Family members at bedside stating that her speech sounds as if it is back to baseline. Patient is able to follow all commands and express herself without difficulty. Just as I was leaving the room patient was being brought to her CT of head  Objective: Current vital signs: BP (!) 118/44 (BP Location: Right Arm)   Pulse 76   Temp 98.9 F (37.2 C) (Oral)   Resp 16   Ht 4\' 11"  (1.499 m)   Wt 52 kg (114 lb 11.2 oz)   SpO2 99%   BMI 23.17 kg/m  Vital signs in last 24 hours: Temp:  [98.9 F (37.2 C)-99.6 F (37.6 C)] 98.9 F (37.2 C) (08/30 0452) Pulse Rate:  [0-113] 76 (08/30 0452) Resp:  [0-80] 16 (08/30 0452) BP: (101-161)/(44-95) 118/44 (08/30 0452) SpO2:  [0 %-100 %] 99 % (08/30 0452) Weight:  [52 kg (114 lb 11.2 oz)] 52 kg (114 lb 11.2 oz) (08/30 0452)  Intake/Output from previous day: 08/29 0701 - 08/30 0700 In: 558.3 [P.O.:480; I.V.:78.3] Out: 550 [Urine:550] Intake/Output this shift: No intake/output data recorded. Nutritional status: Diet heart healthy/carb modified Room service appropriate? Yes; Fluid consistency: Thin      Neurologic Exam: General: NAD Mental Status: Alert, oriented to hospital and daughter along with month,   Speech dysarthric without evidence of aphasia.  Able to follow 3 step commands without difficulty. Cranial Nerves: II:  Visual fields grossly normal, pupils equal, round, reactive to light and accommodation III,IV, VI: ptosis not present, extra-ocular motions intact bilaterally V,VII: smile symmetric, facial light touch sensation normal bilaterally VIII: hearing normal bilaterally IX,X: uvula rises symmetrically XI: bilateral shoulder shrug XII: midline tongue extension without atrophy or fasciculations  Motor: Right :            Upper extremity   5/5                                                                              Left:     Upper extremity   5/5                       Lower  extremity   5/5                                                                                                    Lower extremity   5/5 Tone and bulk:normal tone throughout; no atrophy noted Sensory: Pinprick and light touch intact throughout, bilaterally Deep Tendon Reflexes:  Right: Upper Extremity  Left: Upper extremity   biceps (C-5 to C-6) 2/4                                           biceps (C-5 to C-6) 2/4 tricep (C7) 2/4                                                                             triceps (C7) 2/4 Brachioradialis (C6) 2/4                                          Brachioradialis (C6) 2/4  Lower Extremity Lower Extremity  quadriceps (L-2 to L-4) 2/4                                     quadriceps (L-2 to L-4) 2/4 Achilles (S1) 2/4                                                                Achilles (S1) 2/4  Plantars: Right: downgoing                                                              Left: downgoing Cerebellar: normal finger-to-nose,  normal heel-to-shin test  Lab Results: Basic Metabolic Panel:  Recent Labs Lab 01/13/16 1549  01/13/16 1700 01/13/16 2359 01/14/16 0548 01/15/16 0248 01/16/16 0334  NA  --   --  130* 133* 135 135 133*  K  --   --  4.4 4.9 4.4 4.5 3.9  CL  --   --  98* 100* 104 104 103  CO2  --   --  21* 25 24 25 23   GLUCOSE  --   --  257* 159* 99 98 114*  BUN  --   --  53* 42* 37* 35* 29*  CREATININE  --   --  1.26* 1.29* 1.17* 1.11* 1.23*  CALCIUM  --   < > 8.4* 8.7* 8.3* 8.7* 8.5*  MG 1.8  --   --  1.5* 1.5* 2.2 1.6*  PHOS  --   --   --  3.9 3.6 4.1 3.2  < > = values in this interval not displayed.  Liver Function Tests:  Recent Labs Lab 01/13/16 1700 01/13/16 2359 01/15/16 0248 01/16/16 0334  AST 141* 106* 52* 32  ALT 146* 143* 91* 51  ALKPHOS 49 48 45 44  BILITOT 0.6 1.0 0.7 0.5  PROT 6.7 7.3 6.6 6.4*  ALBUMIN 3.3* 3.4* 3.1* 3.1*  No results for input(s): LIPASE,  AMYLASE in the last 168 hours. No results for input(s): AMMONIA in the last 168 hours.  CBC:  Recent Labs Lab 01/13/16 1549 01/13/16 2359 01/14/16 0548 01/15/16 0248 01/16/16 0334  WBC 18.9* 15.9* 11.1* 11.0* 14.3*  NEUTROABS 15.2*  --   --   --   --   HGB 12.2 11.6* 9.9* 10.1* 9.8*  HCT 36.1 35.5* 30.3* 31.5* 30.0*  MCV 96.1 96.5 96.5 97.5 96.5  PLT 293 259 234 192 202    Cardiac Enzymes:  Recent Labs Lab 01/13/16 1549 01/13/16 2359 01/14/16 0548 01/14/16 1224  TROPONINI 0.03* 0.04* 0.05* 0.04*    Lipid Panel:  Recent Labs Lab 01/14/16 1224  CHOL 122  TRIG 86  HDL 48  CHOLHDL 2.5  VLDL 17  LDLCALC 57    CBG:  Recent Labs Lab 01/14/16 2113 01/15/16 0749 01/15/16 1622 01/15/16 2051 01/16/16 0733  GLUCAP 112* 92 152* 112* 131*    Microbiology: Results for orders placed or performed during the hospital encounter of 01/13/16  MRSA PCR Screening     Status: None   Collection Time: 01/13/16  8:12 PM  Result Value Ref Range Status   MRSA by PCR NEGATIVE NEGATIVE Final    Comment:        The GeneXpert MRSA Assay (FDA approved for NASAL specimens only), is one component of a comprehensive MRSA colonization surveillance program. It is not intended to diagnose MRSA infection nor to guide or monitor treatment for MRSA infections.   Culture, blood (routine x 2)     Status: None (Preliminary result)   Collection Time: 01/13/16 11:59 PM  Result Value Ref Range Status   Specimen Description BLOOD LEFT ANTECUBITAL  Final   Special Requests BOTTLES DRAWN AEROBIC ONLY 5CC  Final   Culture NO GROWTH 1 DAY  Final   Report Status PENDING  Incomplete  Culture, blood (routine x 2)     Status: None (Preliminary result)   Collection Time: 01/14/16 12:08 AM  Result Value Ref Range Status   Specimen Description BLOOD LEFT HAND  Final   Special Requests IN PEDIATRIC BOTTLE 4CC  Final   Culture NO GROWTH 1 DAY  Final   Report Status PENDING  Incomplete     Coagulation Studies:  Recent Labs  01/13/16 2359  LABPROT 14.6  INR 1.14    Imaging: Dg Chest 2 View  Result Date: 01/16/2016 CLINICAL DATA:  82 year old female with history of recent pacemaker placement. Arm soreness today. Cough. EXAM: CHEST  2 VIEW COMPARISON:  Chest x-ray 12/06/2015. FINDINGS: Left-sided pacemaker device in place with lead tips projecting over the expected location of the right atrium and right ventricular apex. Lung volumes are normal. Mild diffuse coarse interstitial markings appear to be chronic and are similar to prior examinations. No acute consolidative airspace disease. No pleural effusions. No pneumothorax. No definite suspicious appearing pulmonary nodules or masses. No evidence of pulmonary edema. Heart size is mildly enlarged. Upper mediastinal contours are within normal limits. Aortic atherosclerosis. IMPRESSION: 1. Interval placement of new left-sided pacemaker device which appears appropriately located. No pneumothorax or other acute complicating features. 2. Mild cardiomegaly. 3. Aortic atherosclerosis. Electronically Signed   By: Trudie Reed M.D.   On: 01/16/2016 07:19   Ct Angio Neck W Or Wo Contrast  Result Date: 01/15/2016 CLINICAL DATA:  Dysarthria. Recent seizure like activity in the setting of cardiac arrhythmia. EXAM: CT ANGIOGRAPHY NECK TECHNIQUE: Multidetector CT imaging of the neck was performed  using the standard protocol during bolus administration of intravenous contrast. Multiplanar CT image reconstructions and MIPs were obtained to evaluate the vascular anatomy. Carotid stenosis measurements (when applicable) are obtained utilizing NASCET criteria, using the distal internal carotid diameter as the denominator. CONTRAST:  50 mL Isovue 370 COMPARISON:  01/04/2016 FINDINGS: Aortic arch: 3 vessel aortic arch with moderate atherosclerotic plaque. Atherosclerotic luminal irregularity involving the subclavian arteries without significant  stenosis. Right carotid system: Atherosclerotic plaque at the carotid bifurcation without stenosis, unchanged. A 4 mm soft tissue density projecting into the posterior aspect of the proximal ICA lumen is unchanged. Left carotid system: Mild atherosclerotic plaque about the carotid bifurcation without stenosis, unchanged. Vertebral arteries: Patent vertebral arteries without evidence of stenosis. The right vertebral artery is slightly larger than the left. Skeleton: Mild multilevel cervical spondylosis. Moderate left-sided cervical facet arthrosis. Other neck: Right-sided facial hematoma has decreased in density and slightly decreased in size, now measuring 4.0 x 3.1 cm. Upper chest: Minimal dependent atelectasis in the upper lobes. IMPRESSION: 1. Atherosclerosis without significant stenosis. 2. Unchanged soft tissue density projecting into the proximal right ICA which could represent focal soft plaque/adherent thrombus, small intimal flap, or web. 3. Evolving right facial hematoma. Electronically Signed   By: Sebastian Ache M.D.   On: 01/15/2016 07:49    Medications:  Scheduled: . famotidine (PEPCID) IV  20 mg Intravenous Q12H  . heparin  5,000 Units Subcutaneous Q8H  . insulin aspart  0-5 Units Subcutaneous QHS  . insulin aspart  0-9 Units Subcutaneous TID WC  . levothyroxine  50 mcg Oral QAC breakfast  . mouth rinse  15 mL Mouth Rinse BID  . pantoprazole  40 mg Oral Daily    Assessment/Plan: Assessment and Plan: 1. Possible seizure: She was noted to have what is described as generalized tonic clonic activity in the emergency department. However, this was all associated with arrhythmias on telemetry, including asystole and heart block. Given that these episodes appear to been provoked by arrhythmia and subsequent hypoperfusion of the brain, I do not think antiepileptic medication is empirically indicated. I would proceed with correction of her arrhythmia to see if this eradicate these episodes. EEG  was obtained and did not show any epileptiform activity. At this time will not start an antiepileptic medication.   2. Dysarthria: By reports this is a new development. This is concerning for possible ischemic infarction in the setting of arrhythmia. No obvious acute ischemia was noted on her CT scan though this does not exclude this diagnosis. She was noted to have a small luminal irregularity in the proximal right internal carotid artery on a CT angiography of the neck on 01/04/16. This was not occlusive and its significance was unclear, with the radiologist interpreting it is possible coronary atherosclerosis or a nonocclusive web with consideration of a small dissection in the setting of her recent fall. This will need to be reexamined and I will repeat CT angiogram of the neck. Echocardiogram showed EF of 60% with no PFO or thrombus. Recent hemoglobin A1c was 6.2 on 12/24/15. LDL 57. She would likely benefit from aspirin 81 mg daily if no contra indication from a cardiac perspective.  At this time given her continued dysarthria we'll repeat CT of brain to evaluate for a evolve stroke. If CT of head shows no stroke no further workup would be necessary. And neurology will sign off. His has been discussed with Dr. Delford Field a lot oh.       Felicie Morn PA-C [facility] [phone]  01/16/2016, 9:46 AM    Neurology Attending Addendum: Agree with PA note above. I have reviewed today's CT. There is no evidence of stroke and this is unchanged from her prior scan on 01/13/16. Recs as per PA note. ST for dysarthria. No role for AEDs at this time as spells seem to be due to arrhythmia. Signing off, call if new issues arise.

## 2016-01-16 NOTE — Progress Notes (Signed)
Physical Therapy Treatment Patient Details Name: Diana Braun MRN: [id number] DOB: [birth year] Today's Date: 01/16/2016    History of Present Illness 82 year old female with PMH as below, which is significant for DM (now off meds), HTN, hypothyroidism, and HLD.  She had recent hospitalization 8/7-8/9 due to dizziness and syncope and was treated for UTI.  Admitted with seizure-like activity and N&V; found to have several different arrythmias in ED including ST, AF, "escape rhythm" and CHB.  She is now s/p PPM.     PT Comments    Assisted pt to bedside commode then back to bed. Ambulation deferred 2* dizziness. Pt c/o pain at pacemaker insertion site throughout treatment, she was pre-medicated with Tylenol. Pain and dizziness are limiting progression of mobility.   Follow Up Recommendations  Home health PT     Equipment Recommendations  Wheelchair (measurements PT)    Recommendations for Other Services       Precautions / Restrictions Precautions Precautions: Fall;ICD/Pacemaker Restrictions Weight Bearing Restrictions: No    Mobility  Bed Mobility Overal bed mobility: Needs Assistance Bed Mobility: Supine to Sit;Sit to Supine     Supine to sit: Mod assist;HOB elevated Sit to supine: Mod assist   General bed mobility comments: assist to lift trunk and scoot to EOB; assist for legs into bed and to lie down without using L arm  Transfers Overall transfer level: Needs assistance Equipment used: 1 person hand held assist   Sit to Stand: Mod assist Stand pivot transfers: Mod assist       General transfer comment: bed <> BSC with assist and verbal cues for hand placement, pt reports dizziness in sitting, HR 107 with activity  Ambulation/Gait             General Gait Details: unable 2* dizziness   Stairs            Wheelchair Mobility    Modified Rankin (Stroke Patients Only)       Balance     Sitting balance-Leahy Scale: Fair       Standing  balance-Leahy Scale: Poor                      Cognition Arousal/Alertness: Awake/alert Behavior During Therapy: WFL for tasks assessed/performed Overall Cognitive Status: Within Functional Limits for tasks assessed                      Exercises      General Comments        Pertinent Vitals/Pain Pain Score: 5  Pain Location: L shoulder at pacemaker insertion site Pain Descriptors / Indicators: Sore Pain Intervention(s): Monitored during session;Limited activity within patient's tolerance;Premedicated before session    Home Living                      Prior Function            PT Goals (current goals can now be found in the care plan section) Acute Rehab PT Goals Patient Stated Goal: To walk, get stronger PT Goal Formulation: With patient/family Time For Goal Achievement: 01/22/16 Potential to Achieve Goals: Good Progress towards PT goals: Not progressing toward goals - comment (limited by dizziness)    Frequency  Min 3X/week    PT Plan      Co-evaluation             End of Session Equipment Utilized During Treatment: Gait belt Activity Tolerance: Patient limited by  pain;Patient limited by fatigue Patient left: in bed;with call bell/phone within reach;with family/visitor present     Time: 0908-0920 PT Time Calculation (min) (ACUTE ONLY): 12 min  Charges:  $Therapeutic Activity: 8-22 mins                    G Codes:      Tamala Ser 01/16/2016, 9:32 AM [phone]

## 2016-01-16 NOTE — Care Management Important Message (Signed)
Important Message  Patient Details  Name: Diana Braun MRN: [id number] Date of Birth: [birth year]   Medicare Important Message Given:  Yes    Jondavid Schreier Abena 01/16/2016, 1:24 PM

## 2016-01-17 DIAGNOSIS — D509 Iron deficiency anemia, unspecified: Secondary | ICD-10-CM

## 2016-01-17 LAB — CBC
HEMATOCRIT: 25.4 % — AB (ref 36.0–46.0)
HEMOGLOBIN: 8.3 g/dL — AB (ref 12.0–15.0)
MCH: 31.3 pg (ref 26.0–34.0)
MCHC: 32.7 g/dL (ref 30.0–36.0)
MCV: 95.8 fL (ref 78.0–100.0)
Platelets: 166 10*3/uL (ref 150–400)
RBC: 2.65 MIL/uL — ABNORMAL LOW (ref 3.87–5.11)
RDW: 14.6 % (ref 11.5–15.5)
WBC: 10.7 10*3/uL — ABNORMAL HIGH (ref 4.0–10.5)

## 2016-01-17 LAB — GLUCOSE, CAPILLARY
GLUCOSE-CAPILLARY: 133 mg/dL — AB (ref 65–99)
GLUCOSE-CAPILLARY: 188 mg/dL — AB (ref 65–99)
Glucose-Capillary: 126 mg/dL — ABNORMAL HIGH (ref 65–99)
Glucose-Capillary: 120 mg/dL — ABNORMAL HIGH (ref 65–99)

## 2016-01-17 LAB — RETICULOCYTES
RBC.: 3.04 MIL/uL — ABNORMAL LOW (ref 3.87–5.11)
Retic Count, Absolute: 85.1 10*3/uL (ref 19.0–186.0)
Retic Ct Pct: 2.8 % (ref 0.4–3.1)

## 2016-01-17 LAB — COMPREHENSIVE METABOLIC PANEL
ALBUMIN: 2.6 g/dL — AB (ref 3.5–5.0)
ALK PHOS: 43 U/L (ref 38–126)
ALT: 23 U/L (ref 14–54)
AST: 23 U/L (ref 15–41)
Anion gap: 5 (ref 5–15)
BUN: 24 mg/dL — AB (ref 6–20)
CO2: 23 mmol/L (ref 22–32)
Calcium: 7.8 mg/dL — ABNORMAL LOW (ref 8.9–10.3)
Chloride: 106 mmol/L (ref 101–111)
Creatinine, Ser: 1.22 mg/dL — ABNORMAL HIGH (ref 0.44–1.00)
GFR calc Af Amer: 44 mL/min — ABNORMAL LOW (ref >60)
GFR calc non Af Amer: 38 mL/min — ABNORMAL LOW (ref >60)
GLUCOSE: 125 mg/dL — AB (ref 65–99)
Potassium: 3.8 mmol/L (ref 3.5–5.1)
SODIUM: 134 mmol/L — AB (ref 135–145)
Total Bilirubin: 0.5 mg/dL (ref 0.3–1.2)
Total Protein: 5.5 g/dL — ABNORMAL LOW (ref 6.5–8.1)

## 2016-01-17 LAB — IRON AND TIBC
IRON: 23 ug/dL — AB (ref 28–170)
Saturation Ratios: 15 % (ref 10.4–31.8)
TIBC: 155 ug/dL — AB (ref 250–450)
UIBC: 132 ug/dL

## 2016-01-17 LAB — FOLATE: FOLATE: 25.6 ng/mL (ref >5.9)

## 2016-01-17 LAB — HEMOGLOBIN AND HEMATOCRIT, BLOOD
HEMATOCRIT: 27.9 % — AB (ref 36.0–46.0)
Hemoglobin: 8.9 g/dL — ABNORMAL LOW (ref 12.0–15.0)

## 2016-01-17 LAB — FERRITIN: Ferritin: 1450 ng/mL — ABNORMAL HIGH (ref 11–307)

## 2016-01-17 LAB — VITAMIN B12: VITAMIN B 12: 324 pg/mL (ref 180–914)

## 2016-01-17 MED ORDER — POLYETHYLENE GLYCOL 3350 17 G PO PACK
17.0000 g | PACK | Freq: Every day | ORAL | Status: DC
  Administered 2016-01-17 – 2016-01-18 (×2): 17 g via ORAL
  Filled 2016-01-17 (×2): qty 1

## 2016-01-17 MED ORDER — FERROUS SULFATE 325 (65 FE) MG PO TABS
325.0000 mg | ORAL_TABLET | Freq: Two times a day (BID) | ORAL | Status: DC
  Administered 2016-01-17 – 2016-01-19 (×4): 325 mg via ORAL
  Filled 2016-01-17 (×4): qty 1

## 2016-01-17 MED ORDER — SENNOSIDES-DOCUSATE SODIUM 8.6-50 MG PO TABS
1.0000 | ORAL_TABLET | Freq: Two times a day (BID) | ORAL | Status: DC
  Administered 2016-01-17 – 2016-01-18 (×3): 1 via ORAL
  Filled 2016-01-17 (×4): qty 1

## 2016-01-17 NOTE — Plan of Care (Signed)
Problem: Safety: Goal: Ability to remain free from injury will improve Outcome: Progressing Patient with history of more than one fall in the last six months.  Due to that patient is a high fall risk while here at the hospital.  RN instructed patient to call and wait for staff assistance prior to getting out of bed.  Patient and patient's daughter agreeable to RN instruction and have called appropriately and patient has waited for staff assistance thus far this shift prior to patient getting out of bed.

## 2016-01-17 NOTE — Progress Notes (Signed)
PROGRESS NOTE    Diana Braun  ZOX:[id number] DOB: 08/03/1922 DOA: 01/13/2016 PCP: Emogene Morgan, MD  Brief Narrative: 82 year old female with PMH as below, which is significant for DM (now off meds), HTN, hypothyroidism, and HLD. [place] is [place] and has only been in the [place] for a few months. She has already been hospitalized once 8/7-8/9. Chief complaint at that time was dizziness and syncope. She was found to have UTI secondary to GBS and was treated with amoxicillin. Symptoms of orthostasis, dizziness, and syncope were attributed to UTI.   She was discharged to home with home health, however, she presented to ED again 8/18 with complaint of syncope and shaking during LOC. Workup was negative and she was discharged home with cardiology and neurology follow up.  8/27 she again presented to ED at [facility] for syncope with "seizure-like" activity and nausea. Upon arrival to the ED she was actively seizing and vomiting. Very brief duration.  Treated with Keppra. On telemetry seizures would coincide with long pauses. Patient had several different arrythmias in ED including ST, AF, "escape rhythm" and CHB. She was transferred to [facility] for cardiology evaluation and pacing.    Assessment & Plan:   Active Problems:   A-fib (HCC)   Syncope and collapse   Heart block   Hyponatremia   Hypothyroidism   Convulsions (HCC)   Dysarthria  Syncope, CHB, asystolic events  S/P pacemaker.  Stable. Cardiology to evaluate pacemaker site.   Dizziness; IV fluids. Repeat CT head. PRN meclizine, Pepcid for reflux, and nausea.  Improved.   Anemia;  Drop in hb. Denies melena, or blood in the stool.  Check anemia panel today, which results consist with iron deficiency anemia.  Suspect their is some component of hemodilution.  Start iron today.  Repeat hb in am.   Hyponatremia;improved with  IV fluids.   Leukocytosis, no clear source or concern for infection besides WBC Defer ABX for  now  Hypothyroidism; continue with synthroid.   Possible Seizure:  Per neurology no need for AED, patients spell might be related to arrhythmia.   P. A fib cardiology following.  No anticoagulation at this time due to recent fall, facial hematoma. Follow up out patient to consider start anticoagulation if no further falls.    Dysarthria; improved. Ct head negative for stroke.   DVT prophylaxis: SCD.  Code Status:  Full code.  Family Communication: daughter at bedside.  Disposition Plan: home in 24 hours.    Consultants:   Cardiology  CCM   Procedures:   ECHO; normal EF.   Antimicrobials:  none  Subjective: Patient is feeling better today. Denies dizziness. No BM.   Objective: Vitals:   01/16/16 1554 01/16/16 2138 01/17/16 0532 01/17/16 1330  BP: (!) 108/53 (!) 104/36 (!) 127/57 (!) 155/54  Pulse: 72 72 75 79  Resp: 16 18 20  (!) 22  Temp: 98.5 F (36.9 C) 98.5 F (36.9 C) 98 F (36.7 C) 98.3 F (36.8 C)  TempSrc: Oral Oral Oral Oral  SpO2: 100% 96% 97% 100%  Weight:   52.6 kg (115 lb 15.4 oz)   Height:        Intake/Output Summary (Last 24 hours) at 01/17/16 1440 Last data filed at 01/17/16 0830  Gross per 24 hour  Intake          2893.33 ml  Output              800 ml  Net  2093.33 ml   Filed Weights   01/15/16 0300 01/16/16 0452 01/17/16 0532  Weight: 55.2 kg (121 lb 11.1 oz) 52 kg (114 lb 11.2 oz) 52.6 kg (115 lb 15.4 oz)    Examination:  General exam: Appears calm and comfortable  Respiratory system: Clear to auscultation. Respiratory effort normal. Cardiovascular system: S1 & S2 heard, RRR. No JVD, murmurs, rubs, gallops or clicks. No pedal edema. Gastrointestinal system: Abdomen is nondistended, soft and nontender. No organomegaly or masses felt. Normal bowel sounds heard. Central nervous system: Alert and oriented. No focal neurological deficits. Extremities: Symmetric 5 x 5 power. Skin: No rashes, lesions or ulcers Psychiatry:  Judgement and insight appear normal. Mood & affect appropriate.     Data Reviewed: I have personally reviewed following labs and imaging studies  CBC:  Recent Labs Lab 01/13/16 1549 01/13/16 2359 01/14/16 0548 01/15/16 0248 01/16/16 0334 01/17/16 0247 01/17/16 0902  WBC 18.9* 15.9* 11.1* 11.0* 14.3* 10.7*  --   NEUTROABS 15.2*  --   --   --   --   --   --   HGB 12.2 11.6* 9.9* 10.1* 9.8* 8.3* 8.9*  HCT 36.1 35.5* 30.3* 31.5* 30.0* 25.4* 27.9*  MCV 96.1 96.5 96.5 97.5 96.5 95.8  --   PLT 293 259 234 192 202 166  --    Basic Metabolic Panel:  Recent Labs Lab 01/13/16 1549  01/13/16 2359 01/14/16 0548 01/15/16 0248 01/16/16 0334 01/17/16 0247  NA  --   < > 133* 135 135 133* 134*  K  --   < > 4.9 4.4 4.5 3.9 3.8  CL  --   < > 100* 104 104 103 106  CO2  --   < > 25 24 25 23 23   GLUCOSE  --   < > 159* 99 98 114* 125*  BUN  --   < > 42* 37* 35* 29* 24*  CREATININE  --   < > 1.29* 1.17* 1.11* 1.23* 1.22*  CALCIUM  --   < > 8.7* 8.3* 8.7* 8.5* 7.8*  MG 1.8  --  1.5* 1.5* 2.2 1.6*  --   PHOS  --   --  3.9 3.6 4.1 3.2  --   < > = values in this interval not displayed. GFR: Estimated Creatinine Clearance: 22.3 mL/min (by C-G formula based on SCr of 1.22 mg/dL). Liver Function Tests:  Recent Labs Lab 01/13/16 1700 01/13/16 2359 01/15/16 0248 01/16/16 0334 01/17/16 0247  AST 141* 106* 52* 32 23  ALT 146* 143* 91* 51 23  ALKPHOS 49 48 45 44 43  BILITOT 0.6 1.0 0.7 0.5 0.5  PROT 6.7 7.3 6.6 6.4* 5.5*  ALBUMIN 3.3* 3.4* 3.1* 3.1* 2.6*   No results for input(s): LIPASE, AMYLASE in the last 168 hours. No results for input(s): AMMONIA in the last 168 hours. Coagulation Profile:  Recent Labs Lab 01/13/16 2359  INR 1.14   Cardiac Enzymes:  Recent Labs Lab 01/13/16 1549 01/13/16 2359 01/14/16 0548 01/14/16 1224  TROPONINI 0.03* 0.04* 0.05* 0.04*   BNP (last 3 results) No results for input(s): PROBNP in the last 8760 hours. HbA1C: No results for input(s):  HGBA1C in the last 72 hours. CBG:  Recent Labs Lab 01/16/16 1131 01/16/16 1637 01/16/16 2200 01/17/16 0725 01/17/16 1146  GLUCAP 131* 152* 149* 126* 133*   Lipid Profile: No results for input(s): CHOL, HDL, LDLCALC, TRIG, CHOLHDL, LDLDIRECT in the last 72 hours. Thyroid Function Tests: No results for input(s): TSH, T4TOTAL,  FREET4, T3FREE, THYROIDAB in the last 72 hours. Anemia Panel:  Recent Labs  01/17/16 1012  VITAMINB12 324  FOLATE 25.6  FERRITIN 1,450*  TIBC 155*  IRON 23*  RETICCTPCT 2.8   Sepsis Labs: No results for input(s): PROCALCITON, LATICACIDVEN in the last 168 hours.  Recent Results (from the past 240 hour(s))  MRSA PCR Screening     Status: None   Collection Time: 01/13/16  8:12 PM  Result Value Ref Range Status   MRSA by PCR NEGATIVE NEGATIVE Final    Comment:        The GeneXpert MRSA Assay (FDA approved for NASAL specimens only), is one component of a comprehensive MRSA colonization surveillance program. It is not intended to diagnose MRSA infection nor to guide or monitor treatment for MRSA infections.   Culture, blood (routine x 2)     Status: None (Preliminary result)   Collection Time: 01/13/16 11:59 PM  Result Value Ref Range Status   Specimen Description BLOOD LEFT ANTECUBITAL  Final   Special Requests BOTTLES DRAWN AEROBIC ONLY 5CC  Final   Culture NO GROWTH 3 DAYS  Final   Report Status PENDING  Incomplete  Culture, blood (routine x 2)     Status: None (Preliminary result)   Collection Time: 01/14/16 12:08 AM  Result Value Ref Range Status   Specimen Description BLOOD LEFT HAND  Final   Special Requests IN PEDIATRIC BOTTLE 4CC  Final   Culture NO GROWTH 3 DAYS  Final   Report Status PENDING  Incomplete         Radiology Studies: Dg Chest 2 View  Result Date: 01/16/2016 CLINICAL DATA:  82 year old female with history of recent pacemaker placement. Arm soreness today. Cough. EXAM: CHEST  2 VIEW COMPARISON:  Chest x-ray  12/06/2015. FINDINGS: Left-sided pacemaker device in place with lead tips projecting over the expected location of the right atrium and right ventricular apex. Lung volumes are normal. Mild diffuse coarse interstitial markings appear to be chronic and are similar to prior examinations. No acute consolidative airspace disease. No pleural effusions. No pneumothorax. No definite suspicious appearing pulmonary nodules or masses. No evidence of pulmonary edema. Heart size is mildly enlarged. Upper mediastinal contours are within normal limits. Aortic atherosclerosis. IMPRESSION: 1. Interval placement of new left-sided pacemaker device which appears appropriately located. No pneumothorax or other acute complicating features. 2. Mild cardiomegaly. 3. Aortic atherosclerosis. Electronically Signed   By: Trudie Reedaniel  Entrikin M.D.   On: 01/16/2016 07:19   Ct Head Wo Contrast  Result Date: 01/16/2016 CLINICAL DATA:  Dizziness. Recent cardiac catheterization and EP implantable device placement. EXAM: CT HEAD WITHOUT CONTRAST TECHNIQUE: Contiguous axial images were obtained from the base of the skull through the vertex without intravenous contrast. COMPARISON:  CT head 01/13/2016 FINDINGS: Brain: There is no evidence of acute intracranial hemorrhage, mass lesion, brain edema or extra-axial fluid collection. The ventricles and subarachnoid spaces are appropriately sized for age. There is no CT evidence of acute cortical infarction. There are stable mild chronic periventricular white matter small vessel ischemic changes. There is a stable small infarct inferiorly in the left cerebellar hemisphere. Vascular: Intracranial vascular calcifications are present. Skull: Negative for fracture or focal lesion. Sinuses/Orbits: Stable mild mucosal thickening in the left maxillary and sphenoid sinuses. No air-fluid levels are identified. The mastoid air cells are clear. Other: None. IMPRESSION: Stable head CT demonstrating no acute findings.  Stable chronic small vessel ischemic changes. Electronically Signed   By: Hilarie FredricksonWilliam  Veazey M.D.  On: 01/16/2016 09:54        Scheduled Meds: . famotidine  20 mg Oral Daily  . insulin aspart  0-5 Units Subcutaneous QHS  . insulin aspart  0-9 Units Subcutaneous TID WC  . levothyroxine  50 mcg Oral QAC breakfast  . mouth rinse  15 mL Mouth Rinse BID  . pantoprazole  40 mg Oral Daily  . polyethylene glycol  17 g Oral Daily  . senna-docusate  1 tablet Oral BID   Continuous Infusions:    LOS: 4 days    Time spent: 35 Minutes.     Alba Cory, MD [facility] Pager [phone]  If 7PM-7AM, please contact night-coverage [email or web address] Password [email or web address] 01/17/2016, 2:40 PM

## 2016-01-17 NOTE — Progress Notes (Signed)
Physical Therapy Treatment Patient Details Name: Diana Braun MRN: [id number] DOB: 09/12/1922 Today's Date: 01/17/2016    History of Present Illness 82 year old female with PMH as below, which is significant for DM (now off meds), HTN, hypothyroidism, and HLD.  She had recent hospitalization 8/7-8/9 due to dizziness and syncope and was treated for UTI.  Admitted with seizure-like activity and N&V; found to have several different arrythmias in ED including ST, AF, "escape rhythm" and CHB.  She is now s/p PPM.     PT Comments    Pt with decreased dizziness today and able to ambulate 17' with RW and MIN A.  Limited WB through L UE due to discomfort at L pacemaker site.  Pt with little use of L UE with transfers.  Pt does have 4 steps to enter without rails and will need practice with pt and family on how to manage.  Follow Up Recommendations  Home health PT     Equipment Recommendations  Wheelchair (measurements PT)    Recommendations for Other Services       Precautions / Restrictions Precautions Precautions: Fall;ICD/Pacemaker Restrictions Weight Bearing Restrictions: No    Mobility  Bed Mobility Overal bed mobility: Needs Assistance Bed Mobility: Supine to Sit     Supine to sit: Mod assist     General bed mobility comments: A for trunk primarily, not using L UE at all  Transfers Overall transfer level: Needs assistance Equipment used: 1 person hand held assist;Rolling walker (2 wheeled) Transfers: Sit to/from Stand Sit to Stand: Min assist Stand pivot transfers: Min assist       General transfer comment: Stood and did SPt with MIN HHA and then stood to [employer] for gait  Ambulation/Gait Ambulation/Gait assistance: Architect (Feet): 80 Feet Assistive device: Rolling walker (2 wheeled) Gait Pattern/deviations: Decreased step length - right;Decreased step length - left;Narrow base of support Gait velocity: decreased   General Gait Details: Cues for  light WB through L arm with RW which pt appears to be doing anyway for guarding   Stairs            Wheelchair Mobility    Modified Rankin (Stroke Patients Only)       Balance     Sitting balance-Leahy Scale: Fair       Standing balance-Leahy Scale: Poor                      Cognition Arousal/Alertness: Awake/alert Behavior During Therapy: WFL for tasks assessed/performed Overall Cognitive Status: Within Functional Limits for tasks assessed                      Exercises      General Comments        Pertinent Vitals/Pain Pain Assessment: Faces Faces Pain Scale: Hurts whole lot Pain Location: pacemaker site Pain Descriptors / Indicators: Grimacing;Guarding Pain Intervention(s): Monitored during session;Repositioned    Home Living                      Prior Function            PT Goals (current goals can now be found in the care plan section) Acute Rehab PT Goals Patient Stated Goal: To walk, get stronger PT Goal Formulation: With patient/family Time For Goal Achievement: 01/22/16 Potential to Achieve Goals: Good Progress towards PT goals: Progressing toward goals    Frequency  Min 3X/week    PT Plan  Current plan remains appropriate    Co-evaluation             End of Session Equipment Utilized During Treatment: Gait belt Activity Tolerance: Patient tolerated treatment well Patient left: in chair;with call bell/phone within reach;Other (comment) (family in lobby and informed pt was done with PT)     Time: [id number] PT Time Calculation (min) (ACUTE ONLY): 21 min  Charges:  $Gait Training: 8-22 mins                    G Codes:      Deo Mehringer LUBECK 01/17/2016, 11:01 AM

## 2016-01-17 NOTE — Progress Notes (Signed)
Pt's pacer site noted to be more swollen this am than it was last night at end of shift (7:00pm). Renee, NP with EP paged and asked to stop by and assess site. Pt complains only of soreness when touched. Dr. Sunnie Nielsenegalado made aware of request. Agrees site should be assessed. Waiting for return page or for EP to visit patient. Will continue to monitor site closely.

## 2016-01-17 NOTE — Plan of Care (Signed)
Problem: Education: Goal: Knowledge of [facility] Education information/materials will improve Outcome: Progressing Patient's daughter at bedside.  Both patient and patient's daughter aware of plan of care.  Patient able to rate pain using numeric pain rating scale after RN explanation of scale.  Patient also able to describe pain.  Patient stated pain was from a recent fall.  Where patient was having pain, RN did note bruising (see pain assessment in flowsheets for detailed pain assessment.  RN gave patient two Tylenol per PRN order, when RN into check on patient to reassess pain, patient was sleeping.  RN provided medication education pertaining to scheduled Heparin and Protonix.  Patient and patient's daughter stated understanding.

## 2016-01-18 DIAGNOSIS — D509 Iron deficiency anemia, unspecified: Secondary | ICD-10-CM

## 2016-01-18 LAB — GLUCOSE, CAPILLARY
GLUCOSE-CAPILLARY: 157 mg/dL — AB (ref 65–99)
GLUCOSE-CAPILLARY: 139 mg/dL — AB (ref 65–99)
Glucose-Capillary: 103 mg/dL — ABNORMAL HIGH (ref 65–99)
Glucose-Capillary: 160 mg/dL — ABNORMAL HIGH (ref 65–99)

## 2016-01-18 LAB — CBC
HCT: 29.7 % — ABNORMAL LOW (ref 36.0–46.0)
Hemoglobin: 9.5 g/dL — ABNORMAL LOW (ref 12.0–15.0)
MCH: 31.3 pg (ref 26.0–34.0)
MCHC: 32 g/dL (ref 30.0–36.0)
MCV: 97.7 fL (ref 78.0–100.0)
PLATELETS: 199 10*3/uL (ref 150–400)
RBC: 3.04 MIL/uL — AB (ref 3.87–5.11)
RDW: 14.6 % (ref 11.5–15.5)
WBC: 12.8 10*3/uL — ABNORMAL HIGH (ref 4.0–10.5)

## 2016-01-18 LAB — HEMOGLOBIN AND HEMATOCRIT, BLOOD
HEMATOCRIT: 27 % — AB (ref 36.0–46.0)
HEMOGLOBIN: 8.8 g/dL — AB (ref 12.0–15.0)

## 2016-01-18 MED ORDER — FLEET ENEMA 7-19 GM/118ML RE ENEM: 1.0000 | ENEMA | Freq: Once | RECTAL | Status: DC

## 2016-01-18 MED ORDER — BISACODYL 10 MG RE SUPP
10.0000 mg | Freq: Every day | RECTAL | Status: DC | PRN
  Administered 2016-01-18: 10 mg via RECTAL
  Filled 2016-01-18: qty 1

## 2016-01-18 NOTE — Progress Notes (Signed)
Patient temperature earlier this shift was 100.6 degrees Farenheit orally.  Patient given 650mg  of Tylenol, by mouth, per PRN order due to complaints of pacemaker site pain.  Most recent temperature was 98.3 degrees Farenheit orally.  [facility] text paged via Amion with this information.

## 2016-01-18 NOTE — Progress Notes (Signed)
Physical Therapy Treatment Patient Details Name: Dayanis Bergquist MRN: [id number] DOB: 1923-01-09 Today's Date: 01/18/2016    History of Present Illness 82 year old female with PMH as below, which is significant for DM (now off meds), HTN, hypothyroidism, and HLD.  She had recent hospitalization 8/7-8/9 due to dizziness and syncope and was treated for UTI.  Admitted with seizure-like activity and N&V; found to have several different arrythmias in ED including ST, AF, "escape rhythm" and CHB.  She is now s/p PPM.     PT Comments    Pt making steady progress.  Follow Up Recommendations  Home health PT;Supervision/Assistance - 24 hour     Equipment Recommendations  Wheelchair (measurements PT)    Recommendations for Other Services       Precautions / Restrictions Precautions Precautions: Fall;ICD/Pacemaker Restrictions Weight Bearing Restrictions: No    Mobility  Bed Mobility Overal bed mobility: Needs Assistance Bed Mobility: Supine to Sit;Sit to Supine     Supine to sit: Mod assist Sit to supine: Min assist   General bed mobility comments: Assist to elevate trunk into sitting. Assist to bring legs back up into bed to return to supine.  Transfers Overall transfer level: Needs assistance Equipment used: Rolling walker (2 wheeled) Transfers: Sit to/from [employer] Sit to Stand: Min assist Stand pivot transfers: Min assist       General transfer comment: Assist to bring hips up and for balance  Ambulation/Gait Ambulation/Gait assistance: Min assist Ambulation Distance (Feet): 75 Feet Assistive device: Rolling walker (2 wheeled) Gait Pattern/deviations: Step-through pattern;Decreased step length - right;Decreased step length - left;Shuffle;Trunk flexed Gait velocity: decreased Gait velocity interpretation: <1.8 ft/sec, indicative of risk for recurrent falls General Gait Details: Assist for balance and support   Stairs Stairs: Yes Stairs assistance:  Min assist Stair Management: With walker Number of Stairs: 1 General stair comments: Brought single step to room. Stepped up/down step with walker straddling entire step  Wheelchair Mobility    Modified Rankin (Stroke Patients Only)       Balance Overall balance assessment: Needs assistance Sitting-balance support: No upper extremity supported Sitting balance-Leahy Scale: Fair     Standing balance support: Single extremity supported Standing balance-Leahy Scale: Poor                      Cognition Arousal/Alertness: Awake/alert Behavior During Therapy: WFL for tasks assessed/performed Overall Cognitive Status: Within Functional Limits for tasks assessed                      Exercises      General Comments        Pertinent Vitals/Pain Pain Assessment: Faces Faces Pain Scale: Hurts whole lot Pain Location: Pacemaker site Pain Descriptors / Indicators: Grimacing;Guarding Pain Intervention(s): Limited activity within patient's tolerance;Monitored during session;Repositioned    Home Living                      Prior Function            PT Goals (current goals can now be found in the care plan section) Progress towards PT goals: Progressing toward goals    Frequency  Min 3X/week    PT Plan Current plan remains appropriate    Co-evaluation             End of Session Equipment Utilized During Treatment: Gait belt Activity Tolerance: Patient tolerated treatment well Patient left: with call bell/phone within reach;in bed;with bed  alarm set     Time: [id number] PT Time Calculation (min) (ACUTE ONLY): 17 min  Charges:  $Gait Training: 8-22 mins                    G Codes:      Jenette Rayson 01/18/2016, 1:33 PM Fluor CorporationCary Arlina Sabina PT [phone]

## 2016-01-18 NOTE — Care Management Important Message (Signed)
Important Message  Patient Details  Name: Diana Braun MRN: [id number] Date of Birth: 1922-12-12   Medicare Important Message Given:  Yes    Aniza Shor Abena 01/18/2016, 10:08 AM

## 2016-01-18 NOTE — Progress Notes (Signed)
Called to evaluate PPM site. Implanted 01/15/16  There is a hematoma, is soft, not significantly changed from POD #1, mild ecchymosis. Patient reports 2/10 discomfort, controlled with Tylenol Not on any antiplatelet or anticogulant Steri-strips are clean and dry, no bleeding or drainage appreciated Continue to monitor  Wound care and L arm restrictions are re-enforced with the patient today Written instructions are placed in the AVS as well Wound check is scheduled for 01/22/16 to re-evaluate  Francis Dowseenee Parneet Glantz, PA-C

## 2016-01-18 NOTE — Progress Notes (Signed)
PROGRESS NOTE    Diana Braun  ZOX:[id number] DOB: May 16, 1923 DOA: 01/13/2016 PCP: Emogene Morgan, MD  Brief Narrative: 82 year old female with PMH as below, which is significant for DM (now off meds), HTN, hypothyroidism, and HLD. She is [place] and has only been in the [place] for a few months. She has already been hospitalized once 8/7-8/9. Chief complaint at that time was dizziness and syncope. She was found to have UTI secondary to GBS and was treated with amoxicillin. Symptoms of orthostasis, dizziness, and syncope were attributed to UTI.   She was discharged to home with home health, however, she presented to ED again 8/18 with complaint of syncope and shaking during LOC. Workup was negative and she was discharged home with cardiology and neurology follow up.  8/27 she again presented to ED at [facility] for syncope with "seizure-like" activity and nausea. Upon arrival to the ED she was actively seizing and vomiting. Very brief duration.  Treated with Keppra. On telemetry seizures would coincide with long pauses. Patient had several different arrythmias in ED including ST, AF, "escape rhythm" and CHB. She was transferred to [facility] for cardiology evaluation and pacing.    Assessment & Plan:   Active Problems:   A-fib (HCC)   Syncope and collapse   Heart block   Hyponatremia   Hypothyroidism   Convulsions (HCC)   Dysarthria   Anemia, iron deficiency  Syncope, CHB, asystolic events  S/P pacemaker.  Stable. Cardiology to evaluate pacemaker site.   Dizziness; IV fluids. Repeat CT head. PRN meclizine, Pepcid for reflux, and nausea.  Improved.   Anemia;  Drop in hb. Denies melena, or blood in the stool.  Check anemia panel today, which results consist with iron deficiency anemia.  Suspect their is some component of hemodilution.  Continue with iron supplement  Hb stable.   Hyponatremia;improved with  IV fluids.  Constipation; suppository, enema.    Leukocytosis, no clear source or concern for infection besides WBC Defer ABX for now Mild fever , WBC increased this am. Check UA. Incentive spirometry   Hypothyroidism; continue with synthroid.   Possible Seizure:  Per neurology no need for AED, patients spell might be related to arrhythmia.   P. A fib cardiology following.  No anticoagulation at this time due to recent fall, facial hematoma. Follow up out patient to consider start anticoagulation if no further falls.    Dysarthria; improved. Ct head negative for stroke.   DVT prophylaxis: SCD.  Code Status:  Full code.  Family Communication: daughter at bedside.  Disposition Plan: home in 24 hours.    Consultants:   Cardiology  CCM   Procedures:   ECHO; normal EF.   Antimicrobials:  none  Subjective: No dizziness, no BM yet.    Objective: Vitals:   01/17/16 1330 01/17/16 1900 01/18/16 0355 01/18/16 1330  BP: (!) 155/54 (!) 101/43 (!) 116/47 (!) 134/48  Pulse: 79 62 69 77  Resp: (!) 22  16 16   Temp: 98.3 F (36.8 C) (!) 100.6 F (38.1 C) 98.3 F (36.8 C) 98.6 F (37 C)  TempSrc: Oral Oral Oral Oral  SpO2: 100% 98% 98% 100%  Weight:   52.6 kg (116 lb)   Height:        Intake/Output Summary (Last 24 hours) at 01/18/16 1424 Last data filed at 01/18/16 1239  Gross per 24 hour  Intake              920 ml  Output             1000 ml  Net              -80 ml   Filed Weights   01/16/16 0452 01/17/16 0532 01/18/16 0355  Weight: 52 kg (114 lb 11.2 oz) 52.6 kg (115 lb 15.4 oz) 52.6 kg (116 lb)    Examination:  General exam: Appears calm and comfortable  Respiratory system: Clear to auscultation. Respiratory effort normal. Cardiovascular system: S1 & S2 heard, RRR. No JVD, murmurs, rubs, gallops or clicks. No pedal edema. Gastrointestinal system: Abdomen is nondistended, soft and nontender. No organomegaly or masses felt. Normal bowel sounds heard. Central nervous system: Alert and oriented. No focal  neurological deficits. Extremities: Symmetric 5 x 5 power. Skin: No rashes, lesions or ulcers Psychiatry: Judgement and insight appear normal. Mood & affect appropriate.     Data Reviewed: I have personally reviewed following labs and imaging studies  CBC:  Recent Labs Lab 01/13/16 1549  01/14/16 0548 01/15/16 0248 01/16/16 0334 01/17/16 0247 01/17/16 0902 01/18/16 0542 01/18/16 0811  WBC 18.9*  < > 11.1* 11.0* 14.3* 10.7*  --   --  12.8*  NEUTROABS 15.2*  --   --   --   --   --   --   --   --   HGB 12.2  < > 9.9* 10.1* 9.8* 8.3* 8.9* 8.8* 9.5*  HCT 36.1  < > 30.3* 31.5* 30.0* 25.4* 27.9* 27.0* 29.7*  MCV 96.1  < > 96.5 97.5 96.5 95.8  --   --  97.7  PLT 293  < > 234 192 202 166  --   --  199  < > = values in this interval not displayed. Basic Metabolic Panel:  Recent Labs Lab 01/13/16 1549  01/13/16 2359 01/14/16 0548 01/15/16 0248 01/16/16 0334 01/17/16 0247  NA  --   < > 133* 135 135 133* 134*  K  --   < > 4.9 4.4 4.5 3.9 3.8  CL  --   < > 100* 104 104 103 106  CO2  --   < > 25 24 25 23 23   GLUCOSE  --   < > 159* 99 98 114* 125*  BUN  --   < > 42* 37* 35* 29* 24*  CREATININE  --   < > 1.29* 1.17* 1.11* 1.23* 1.22*  CALCIUM  --   < > 8.7* 8.3* 8.7* 8.5* 7.8*  MG 1.8  --  1.5* 1.5* 2.2 1.6*  --   PHOS  --   --  3.9 3.6 4.1 3.2  --   < > = values in this interval not displayed. GFR: Estimated Creatinine Clearance: 22.3 mL/min (by C-G formula based on SCr of 1.22 mg/dL). Liver Function Tests:  Recent Labs Lab 01/13/16 1700 01/13/16 2359 01/15/16 0248 01/16/16 0334 01/17/16 0247  AST 141* 106* 52* 32 23  ALT 146* 143* 91* 51 23  ALKPHOS 49 48 45 44 43  BILITOT 0.6 1.0 0.7 0.5 0.5  PROT 6.7 7.3 6.6 6.4* 5.5*  ALBUMIN 3.3* 3.4* 3.1* 3.1* 2.6*   No results for input(s): LIPASE, AMYLASE in the last 168 hours. No results for input(s): AMMONIA in the last 168 hours. Coagulation Profile:  Recent Labs Lab 01/13/16 2359  INR 1.14   Cardiac  Enzymes:  Recent Labs Lab 01/13/16 1549 01/13/16 2359 01/14/16 0548 01/14/16 1224  TROPONINI 0.03* 0.04* 0.05* 0.04*   BNP (last 3 results)  No results for input(s): PROBNP in the last 8760 hours. HbA1C: No results for input(s): HGBA1C in the last 72 hours. CBG:  Recent Labs Lab 01/17/16 1146 01/17/16 1634 01/17/16 2101 01/18/16 0836 01/18/16 1134  GLUCAP 133* 188* 120* 157* 103*   Lipid Profile: No results for input(s): CHOL, HDL, LDLCALC, TRIG, CHOLHDL, LDLDIRECT in the last 72 hours. Thyroid Function Tests: No results for input(s): TSH, T4TOTAL, FREET4, T3FREE, THYROIDAB in the last 72 hours. Anemia Panel:  Recent Labs  01/17/16 1012  VITAMINB12 324  FOLATE 25.6  FERRITIN 1,450*  TIBC 155*  IRON 23*  RETICCTPCT 2.8   Sepsis Labs: No results for input(s): PROCALCITON, LATICACIDVEN in the last 168 hours.  Recent Results (from the past 240 hour(s))  MRSA PCR Screening     Status: None   Collection Time: 01/13/16  8:12 PM  Result Value Ref Range Status   MRSA by PCR NEGATIVE NEGATIVE Final    Comment:        The GeneXpert MRSA Assay (FDA approved for NASAL specimens only), is one component of a comprehensive MRSA colonization surveillance program. It is not intended to diagnose MRSA infection nor to guide or monitor treatment for MRSA infections.   Culture, blood (routine x 2)     Status: None (Preliminary result)   Collection Time: 01/13/16 11:59 PM  Result Value Ref Range Status   Specimen Description BLOOD LEFT ANTECUBITAL  Final   Special Requests BOTTLES DRAWN AEROBIC ONLY 5CC  Final   Culture NO GROWTH 4 DAYS  Final   Report Status PENDING  Incomplete  Culture, blood (routine x 2)     Status: None (Preliminary result)   Collection Time: 01/14/16 12:08 AM  Result Value Ref Range Status   Specimen Description BLOOD LEFT HAND  Final   Special Requests IN PEDIATRIC BOTTLE 4CC  Final   Culture NO GROWTH 4 DAYS  Final   Report Status PENDING   Incomplete         Radiology Studies: No results found.      Scheduled Meds: . famotidine  20 mg Oral Daily  . ferrous sulfate  325 mg Oral BID WC  . insulin aspart  0-5 Units Subcutaneous QHS  . insulin aspart  0-9 Units Subcutaneous TID WC  . levothyroxine  50 mcg Oral QAC breakfast  . mouth rinse  15 mL Mouth Rinse BID  . pantoprazole  40 mg Oral Daily  . polyethylene glycol  17 g Oral Daily  . senna-docusate  1 tablet Oral BID  . sodium phosphate  1 enema Rectal Once   Continuous Infusions:    LOS: 5 days    Time spent: 35 Minutes.     Alba Cory, MD [facility] Pager [phone]  If 7PM-7AM, please contact night-coverage [email or web address] Password [email or web address] 01/18/2016, 2:24 PM

## 2016-01-19 LAB — BASIC METABOLIC PANEL
Anion gap: 8 (ref 5–15)
BUN: 22 mg/dL — AB (ref 6–20)
CALCIUM: 8.5 mg/dL — AB (ref 8.9–10.3)
CO2: 23 mmol/L (ref 22–32)
Chloride: 101 mmol/L (ref 101–111)
Creatinine, Ser: 1.25 mg/dL — ABNORMAL HIGH (ref 0.44–1.00)
GFR calc Af Amer: 42 mL/min — ABNORMAL LOW (ref >60)
GFR, EST NON AFRICAN AMERICAN: 36 mL/min — AB (ref >60)
GLUCOSE: 102 mg/dL — AB (ref 65–99)
Potassium: 4.5 mmol/L (ref 3.5–5.1)
Sodium: 132 mmol/L — ABNORMAL LOW (ref 135–145)

## 2016-01-19 LAB — CBC
HCT: 26.8 % — ABNORMAL LOW (ref 36.0–46.0)
Hemoglobin: 8.8 g/dL — ABNORMAL LOW (ref 12.0–15.0)
MCH: 31.8 pg (ref 26.0–34.0)
MCHC: 32.8 g/dL (ref 30.0–36.0)
MCV: 96.8 fL (ref 78.0–100.0)
PLATELETS: 201 10*3/uL (ref 150–400)
RBC: 2.77 MIL/uL — ABNORMAL LOW (ref 3.87–5.11)
RDW: 14.6 % (ref 11.5–15.5)
WBC: 12.5 10*3/uL — ABNORMAL HIGH (ref 4.0–10.5)

## 2016-01-19 LAB — CULTURE, BLOOD (ROUTINE X 2)
Culture: NO GROWTH
Culture: NO GROWTH

## 2016-01-19 LAB — URINALYSIS, ROUTINE W REFLEX MICROSCOPIC
BILIRUBIN URINE: NEGATIVE
GLUCOSE, UA: NEGATIVE mg/dL
Ketones, ur: NEGATIVE mg/dL
Leukocytes, UA: NEGATIVE
Nitrite: NEGATIVE
PH: 6.5 (ref 5.0–8.0)
Protein, ur: NEGATIVE mg/dL
SPECIFIC GRAVITY, URINE: 1.012 (ref 1.005–1.030)

## 2016-01-19 LAB — URINE MICROSCOPIC-ADD ON
BACTERIA UA: NONE SEEN
RBC / HPF: NONE SEEN RBC/hpf (ref 0–5)
WBC UA: NONE SEEN WBC/hpf (ref 0–5)

## 2016-01-19 LAB — GLUCOSE, CAPILLARY
Glucose-Capillary: 101 mg/dL — ABNORMAL HIGH (ref 65–99)
Glucose-Capillary: 115 mg/dL — ABNORMAL HIGH (ref 65–99)

## 2016-01-19 MED ORDER — FAMOTIDINE 20 MG PO TABS: 20.0000 mg | ORAL_TABLET | Freq: Every day | ORAL | 0 refills | Status: AC

## 2016-01-19 MED ORDER — MECLIZINE HCL 12.5 MG PO TABS: 12.5000 mg | ORAL_TABLET | Freq: Three times a day (TID) | ORAL | 0 refills | Status: AC | PRN

## 2016-01-19 MED ORDER — FERROUS SULFATE 325 (65 FE) MG PO TABS: 325.0000 mg | ORAL_TABLET | Freq: Two times a day (BID) | ORAL | 1 refills | Status: AC

## 2016-01-19 NOTE — Discharge Summary (Signed)
Physician Discharge Summary  Sherlon Nied ZOX:[id number] DOB: 1922-06-07 DOA: 01/13/2016  PCP: Emogene Morgan, MD  Admit date: 01/13/2016 Discharge date: 01/19/2016  Admitted From: Home  Disposition: Home   Recommendations for Outpatient Follow-up:  1. Follow up with PCP in 1-2 weeks 2. Please obtain BMP/CBC in one week 3. Please follow up on the following pending results:  Home Health: yes  Equipment/Devices: wheelchair.   Discharge Condition: stable.  CODE STATUS: full code.  Diet recommendation: Heart Healthy   Brief/Interim Summary: Brief Narrative: 82 year old female with PMH as below, which is significant for DM (now off meds), HTN, hypothyroidism, and HLD. [place] is [place] and has only been in the [place] for a few months. She has already been hospitalized once 8/7-8/9. Chief complaint at that time was dizziness and syncope. She was found to have UTI secondary to GBS and was treated with amoxicillin. Symptoms of orthostasis, dizziness, and syncope were attributed to UTI. She was discharged to home with home health, however, she presented to ED again 8/18 with complaint of syncope and shaking during LOC. Workup was negative and she was discharged home with cardiology and neurology follow up.  8/27 she again presented to ED at [facility] for syncope with "seizure-like" activity and nausea. Upon arrival to the ED she was actively seizing and vomiting. Very brief duration. Treated with Keppra. On telemetry seizures would coincide with long pauses. Patient had several different arrythmias in ED including ST, AF, "escape rhythm" and CHB. She was transferred to [facility] for cardiology evaluation and pacing.    Assessment & Plan:   Active Problems:   A-fib (HCC)   Syncope and collapse   Heart block   Hyponatremia   Hypothyroidism   Convulsions (HCC)   Dysarthria   Anemia, iron deficiency  Syncope, CHB, asystolic events  S/P pacemaker.  Pacemaker site evaluated by  cardio.   Dizziness; IV fluids. Repeat CT head. PRN meclizine, Pepcid for reflux, and nausea.  Improved.   Anemia;  Drop in hb. Denies melena, or blood in the stool.  Check anemia panel today, which results consist with iron deficiency anemia.  Suspect their is some component of hemodilution.  Continue with iron supplement  Hb stable.   Hyponatremia;improved with  IV fluids.  Constipation; resolved.   Leukocytosis, no clear source or concern for infection besides WBC Defer ABX for now Mild fever , WBC stable. Now afebrile. No source for infection identified. UA negative   Hypothyroidism; continue with synthroid.   Possible Seizure:  Per neurology no need for AED, patients spell might be related to arrhythmia.   P. A fib cardiology following.  No anticoagulation at this time due to recent fall, facial hematoma. Follow up out patient to consider start anticoagulation if no further falls.    Dysarthria; improved. Ct head negative for stroke.   Discharge Diagnoses:  Active Problems:   A-fib (HCC)   Syncope and collapse   Heart block   Hyponatremia   Hypothyroidism   Convulsions (HCC)   Dysarthria   Anemia, iron deficiency    Discharge Instructions  Discharge Instructions    Diet - low sodium heart healthy    Complete by:  As directed   Increase activity slowly    Complete by:  As directed       Medication List    STOP taking these medications   metoprolol tartrate 25 MG tablet Commonly known as:  LOPRESSOR     TAKE these medications  acetaminophen 500 MG tablet Commonly known as:  TYLENOL Take 500 mg by mouth every 6 (six) hours as needed (pain).   famotidine 20 MG tablet Commonly known as:  PEPCID Take 1 tablet (20 mg total) by mouth daily.   ferrous sulfate 325 (65 FE) MG tablet Take 1 tablet (325 mg total) by mouth 2 (two) times daily with a meal.   levothyroxine 50 MCG tablet Commonly known as:  SYNTHROID, LEVOTHROID Take 50 mcg by  mouth daily.   meclizine 12.5 MG tablet Commonly known as:  ANTIVERT Take 1 tablet (12.5 mg total) by mouth 3 (three) times daily as needed for dizziness.   triamcinolone cream 0.1 % Commonly known as:  KENALOG Apply 1 application topically 2 (two) times daily as needed (rash).      Follow-up Information    [facility] [address] [facility] Follow up on 01/22/2016.   Specialty:  Cardiology Why:  3:30PM, wound check Contact information: [address] [place] [address] [phone]       Will Jorja Loa, MD Follow up on 04/17/2016.   Specialty:  Cardiology Why:  11:00AM Contact information: [address] [place] [address] [phone]        [facility] .   Why:  Registered Nurse and Physical Therapy Contact information: [address] [place] [address] [phone]          No Known Allergies  Consultations:  Cardiology    Procedures/Studies: Ct Angio Head W Or Wo Contrast  Result Date: 01/04/2016 CLINICAL DATA:  Syncopal episodes. Fell 1 week ago. RIGHT-sided facial swelling. Possible seizure. EXAM: CT ANGIOGRAPHY HEAD AND NECK TECHNIQUE: Multidetector CT imaging of the head and neck was performed using the standard protocol during bolus administration of intravenous contrast. Multiplanar CT image reconstructions and MIPs were obtained to evaluate the vascular anatomy. Carotid stenosis measurements (when applicable) are obtained utilizing NASCET criteria, using the distal internal carotid diameter as the denominator. CONTRAST:  Isovue 370 75 mL. COMPARISON:  CT of the head, cervical spine, and face 12/24/2015. Noncontrast CT head earlier today FINDINGS: CT HEAD Calvarium and skull base: No fracture or destructive lesion. Mastoids and middle ears are grossly clear. Large hematoma RIGHT face. Paranasal sinuses: Imaged portions demonstrate chronic sinus disease LEFT maxillary region. No  blowout injury. Orbits: BILATERAL cataract extraction. Brain: No evidence of acute abnormality, including acute infarct, hemorrhage, hydrocephalus, or mass lesion. Mild atrophy. Hypoattenuation of white matter consistent with chronic microvascular ischemic change. CTA NECK Aortic arch: Standard branching. Imaged portion shows no evidence of aneurysm or dissection. No significant stenosis of the major arch vessel origins. Right carotid system: There is a triangular soft tissue density projecting into the proximal RIGHT internal carotid artery. See for instance coronal image 90 series 7, axial images 70 series 4 and sagittal image 39 series 8. No luminal narrowing, and this appearance can be associated with coronary atherosclerosis or even a nonocclusive web, but given the recent trauma, a small dissection flap cannot be excluded. No distal emboli are evident. No evidence stenosis (50% or greater) or occlusion. Left carotid system: Minor atheromatous change. No evidence of dissection, stenosis (50% or greater) or occlusion. Vertebral arteries: RIGHT vertebral slightly larger. No ostial narrowing. No evidence of dissection, stenosis (50% or greater) or occlusion. Nonvascular soft tissues: Spondylosis. No neck masses. Airway midline. No lung apex lesion or pneumothorax. CTA HEAD Anterior circulation: No significant stenosis, proximal occlusion, aneurysm, or vascular malformation.  Posterior circulation: No significant stenosis, proximal occlusion, aneurysm, or vascular malformation. Venous sinuses: As permitted by contrast timing, patent. Anatomic variants: None of significance. Delayed phase:   No abnormal intracranial enhancement. IMPRESSION: No acute stroke or hemorrhage is evident. No intracranial flow reducing lesion is observed. Triangular soft tissue density projecting into the proximal RIGHT internal carotid artery which could represent a small dissection flap. See discussion above. This is nonocclusive and non  stenotic. No distal emboli are evident. A call is in to the ordering provider. Large facial hematoma, redemonstrated. Electronically Signed   By: Elsie Stain M.D.   On: 01/04/2016 18:19   Dg Chest 1 View  Result Date: 01/13/2016 CLINICAL DATA:  Heart rate elevated EXAM: CHEST 1 VIEW COMPARISON:  01/04/2016 FINDINGS: Aortic atherosclerosis. Heart size is normal. Chronic interstitial coarsening is noted bilaterally No pleural effusion or edema. No airspace consolidation. Chronic left lateral rib fracture deformities noted. IMPRESSION: 1. Chronic lung disease without superimposed acute cardiopulmonary abnormality. 2. Aortic atherosclerosis. Electronically Signed   By: Signa Kell M.D.   On: 01/13/2016 16:15   Dg Chest 2 View  Result Date: 01/16/2016 CLINICAL DATA:  82 year old female with history of recent pacemaker placement. Arm soreness today. Cough. EXAM: CHEST  2 VIEW COMPARISON:  Chest x-ray 12/06/2015. FINDINGS: Left-sided pacemaker device in place with lead tips projecting over the expected location of the right atrium and right ventricular apex. Lung volumes are normal. Mild diffuse coarse interstitial markings appear to be chronic and are similar to prior examinations. No acute consolidative airspace disease. No pleural effusions. No pneumothorax. No definite suspicious appearing pulmonary nodules or masses. No evidence of pulmonary edema. Heart size is mildly enlarged. Upper mediastinal contours are within normal limits. Aortic atherosclerosis. IMPRESSION: 1. Interval placement of new left-sided pacemaker device which appears appropriately located. No pneumothorax or other acute complicating features. 2. Mild cardiomegaly. 3. Aortic atherosclerosis. Electronically Signed   By: Trudie Reed M.D.   On: 01/16/2016 07:19   Dg Chest 2 View  Result Date: 01/04/2016 CLINICAL DATA:  Weakness, fall, possible seizure. EXAM: CHEST  2 VIEW COMPARISON:  12/24/2015 FINDINGS: Lateral view degraded by  patient arm position. Osteopenia. Remote left rib trauma. Midline trachea. Mild cardiomegaly with transverse aortic atherosclerosis. No pleural effusion or pneumothorax. Clear lungs. IMPRESSION: No acute cardiopulmonary disease. Cardiomegaly without congestive failure. Electronically Signed   By: Jeronimo Greaves M.D.   On: 01/04/2016 18:44   Dg Chest 2 View  Result Date: 12/24/2015 CLINICAL DATA:  Got up to go to the bathroom at 0300 hours, became dizzy and fell striking RIGHT side of face, no loss of consciousness, RIGHT facial swelling and bruising, RIGHT side neck pain, history thyroid disease EXAM: CHEST  2 VIEW COMPARISON:  05/25/2013 FINDINGS: Borderline enlargement of cardiac silhouette. Calcified tortuous aorta. Mediastinal contours pulmonary vascularity normal. Chronic interstitial prominence with without infiltrate, pleural effusion or pneumothorax. Bones demineralized with healing fracture of the posterolateral LEFT seventh rib. IMPRESSION: Borderline enlargement of cardiac silhouette. Chronic interstitial lung disease. Aortic atherosclerosis pre Electronically Signed   By: Ulyses Southward M.D.   On: 12/24/2015 08:43   Ct Head Wo Contrast  Result Date: 01/16/2016 CLINICAL DATA:  Dizziness. Recent cardiac catheterization and EP implantable device placement. EXAM: CT HEAD WITHOUT CONTRAST TECHNIQUE: Contiguous axial images were obtained from the base of the skull through the vertex without intravenous contrast. COMPARISON:  CT head 01/13/2016 FINDINGS: Brain: There is no evidence of acute intracranial hemorrhage, mass lesion, brain edema or extra-axial fluid  collection. The ventricles and subarachnoid spaces are appropriately sized for age. There is no CT evidence of acute cortical infarction. There are stable mild chronic periventricular white matter small vessel ischemic changes. There is a stable small infarct inferiorly in the left cerebellar hemisphere. Vascular: Intracranial vascular calcifications  are present. Skull: Negative for fracture or focal lesion. Sinuses/Orbits: Stable mild mucosal thickening in the left maxillary and sphenoid sinuses. No air-fluid levels are identified. The mastoid air cells are clear. Other: None. IMPRESSION: Stable head CT demonstrating no acute findings. Stable chronic small vessel ischemic changes. Electronically Signed   By: Carey BullocksWilliam  Veazey M.D.   On: 01/16/2016 09:54   Ct Head Wo Contrast  Result Date: 01/13/2016 CLINICAL DATA:  Seizures. EXAM: CT HEAD WITHOUT CONTRAST TECHNIQUE: Contiguous axial images were obtained from the base of the skull through the vertex without intravenous contrast. COMPARISON:  01/04/2016 FINDINGS: Brain: There is low attenuation within the subcortical and periventricular white matter compatible with chronic microvascular disease. 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 There is no abnormal extra-axial fluid collection, intracranial hemorrhage or mass. No evidence for acute brain infarct. Vascular: No hyperdense vessel or unexpected calcification. Skull: The osseous skull is intact. Sinuses/Orbits: Mucosal thickening involving the left maxillary sinus. The remaining paranasal sinuses and the mastoid air cells are clear. Other: Gas is identified within the soft tissues overlying the right side of face. IMPRESSION: 1. No acute intracranial abnormalities. 2. Chronic microvascular disease Electronically Signed   By: Signa Kellaylor  Stroud M.D.   On: 01/13/2016 16:59   Ct Head Wo Contrast  Result Date: 01/04/2016 CLINICAL DATA:  Larey SeatFell 3 days ago, nausea and dizziness since fall, episodic dizziness and chest tightness, diabetes mellitus, hypertension EXAM: CT HEAD WITHOUT CONTRAST TECHNIQUE: Contiguous axial images were obtained from the base of the skull through the vertex without intravenous contrast. COMPARISON:  12/24/2015 FINDINGS: Mild atrophy. Normal ventricular morphology. No midline shift or mass effect. Minimal small vessel  chronic ischemic changes of deep cerebral white matter. No intracranial hemorrhage, mass lesion or evidence acute infarction. No extra-axial fluid collections. Atherosclerotic calcification of internal carotid and RIGHT vertebral arteries at skullbase. Bones demineralized. Paranasal sinuses and mastoid air cells clear. IMPRESSION: Atrophy with small vessel chronic ischemic changes of deep cerebral white matter. No acute intracranial abnormalities. Electronically Signed   By: Ulyses SouthwardMark  Boles M.D.   On: 01/04/2016 14:38   Ct Head Wo Contrast  Result Date: 12/24/2015 CLINICAL DATA:  Fall. EXAM: CT HEAD WITHOUT CONTRAST CT MAXILLOFACIAL WITHOUT CONTRAST CT CERVICAL SPINE WITHOUT CONTRAST TECHNIQUE: Multidetector CT imaging of the head, cervical spine, and maxillofacial structures were performed using the standard protocol without intravenous contrast. Multiplanar CT image reconstructions of the cervical spine and maxillofacial structures were also generated. COMPARISON:  None. FINDINGS: CT HEAD FINDINGS Prominence of the sulci and ventricles are identified compatible with brain atrophy. There is diffuse low attenuation within the subcortical and periventricular white matter compatible with chronic microvascular disease. No abnormal extra-axial fluid collection, intracranial hemorrhage or mass identified. No evidence for acute brain infarct. The mastoid air cells appear clear. The calvarium is intact. CT MAXILLOFACIAL FINDINGS The mastoid air cells are clear. There is mild mucosal thickening involving the maxillaries sinuses. No orbital blowout fracture. The mandible is located. The visualized portions of the mandible are intact. The nasal bone is intact. The nasal septum is midline. There is a 4.5 cm hyperdense mass within the soft tissues overlying the right side of face. This is compatible with a hematoma.  CT CERVICAL SPINE FINDINGS Normal alignment of the cervical spine. The vertebral body heights are well preserved.  The facet joints are appear all well aligned. Multi level disc space narrowing and ventral spurring noted compatible with degenerative disc disease. No fractures or subluxations identified. IMPRESSION: 1. No acute intracranial abnormalities. Small vessel ischemic disease and brain atrophy. 2. No evidence for facial bone fracture. There is a large hematoma involving the soft tissues of the right-side of face. 3. No evidence for cervical spine fracture. There is multi level cervical degenerative disc disease Electronically Signed   By: Signa Kell M.D.   On: 12/24/2015 09:17   Ct Angio Neck W Or Wo Contrast  Result Date: 01/15/2016 CLINICAL DATA:  Dysarthria. Recent seizure like activity in the setting of cardiac arrhythmia. EXAM: CT ANGIOGRAPHY NECK TECHNIQUE: Multidetector CT imaging of the neck was performed using the standard protocol during bolus administration of intravenous contrast. Multiplanar CT image reconstructions and MIPs were obtained to evaluate the vascular anatomy. Carotid stenosis measurements (when applicable) are obtained utilizing NASCET criteria, using the distal internal carotid diameter as the denominator. CONTRAST:  50 mL Isovue 370 COMPARISON:  01/04/2016 FINDINGS: Aortic arch: 3 vessel aortic arch with moderate atherosclerotic plaque. Atherosclerotic luminal irregularity involving the subclavian arteries without significant stenosis. Right carotid system: Atherosclerotic plaque at the carotid bifurcation without stenosis, unchanged. A 4 mm soft tissue density projecting into the posterior aspect of the proximal ICA lumen is unchanged. Left carotid system: Mild atherosclerotic plaque about the carotid bifurcation without stenosis, unchanged. Vertebral arteries: Patent vertebral arteries without evidence of stenosis. The right vertebral artery is slightly larger than the left. Skeleton: Mild multilevel cervical spondylosis. Moderate left-sided cervical facet arthrosis. Other neck:  Right-sided facial hematoma has decreased in density and slightly decreased in size, now measuring 4.0 x 3.1 cm. Upper chest: Minimal dependent atelectasis in the upper lobes. IMPRESSION: 1. Atherosclerosis without significant stenosis. 2. Unchanged soft tissue density projecting into the proximal right ICA which could represent focal soft plaque/adherent thrombus, small intimal flap, or web. 3. Evolving right facial hematoma. Electronically Signed   By: Sebastian Ache M.D.   On: 01/15/2016 07:49   Ct Angio Neck W And/or Wo Contrast  Result Date: 01/04/2016 CLINICAL DATA:  Syncopal episodes. Fell 1 week ago. RIGHT-sided facial swelling. Possible seizure. EXAM: CT ANGIOGRAPHY HEAD AND NECK TECHNIQUE: Multidetector CT imaging of the head and neck was performed using the standard protocol during bolus administration of intravenous contrast. Multiplanar CT image reconstructions and MIPs were obtained to evaluate the vascular anatomy. Carotid stenosis measurements (when applicable) are obtained utilizing NASCET criteria, using the distal internal carotid diameter as the denominator. CONTRAST:  Isovue 370 75 mL. COMPARISON:  CT of the head, cervical spine, and face 12/24/2015. Noncontrast CT head earlier today FINDINGS: CT HEAD Calvarium and skull base: No fracture or destructive lesion. Mastoids and middle ears are grossly clear. Large hematoma RIGHT face. Paranasal sinuses: Imaged portions demonstrate chronic sinus disease LEFT maxillary region. No blowout injury. Orbits: BILATERAL cataract extraction. Brain: No evidence of acute abnormality, including acute infarct, hemorrhage, hydrocephalus, or mass lesion. Mild atrophy. Hypoattenuation of white matter consistent with chronic microvascular ischemic change. CTA NECK Aortic arch: Standard branching. Imaged portion shows no evidence of aneurysm or dissection. No significant stenosis of the major arch vessel origins. Right carotid system: There is a triangular soft  tissue density projecting into the proximal RIGHT internal carotid artery. See for instance coronal image 90 series 7, axial images 70 series  4 and sagittal image 39 series 8. No luminal narrowing, and this appearance can be associated with coronary atherosclerosis or even a nonocclusive web, but given the recent trauma, a small dissection flap cannot be excluded. No distal emboli are evident. No evidence stenosis (50% or greater) or occlusion. Left carotid system: Minor atheromatous change. No evidence of dissection, stenosis (50% or greater) or occlusion. Vertebral arteries: RIGHT vertebral slightly larger. No ostial narrowing. No evidence of dissection, stenosis (50% or greater) or occlusion. Nonvascular soft tissues: Spondylosis. No neck masses. Airway midline. No lung apex lesion or pneumothorax. CTA HEAD Anterior circulation: No significant stenosis, proximal occlusion, aneurysm, or vascular malformation. Posterior circulation: No significant stenosis, proximal occlusion, aneurysm, or vascular malformation. Venous sinuses: As permitted by contrast timing, patent. Anatomic variants: None of significance. Delayed phase:   No abnormal intracranial enhancement. IMPRESSION: No acute stroke or hemorrhage is evident. No intracranial flow reducing lesion is observed. Triangular soft tissue density projecting into the proximal RIGHT internal carotid artery which could represent a small dissection flap. See discussion above. This is nonocclusive and non stenotic. No distal emboli are evident. A call is in to the ordering provider. Large facial hematoma, redemonstrated. Electronically Signed   By: Elsie Stain M.D.   On: 01/04/2016 18:19   Ct Cervical Spine Wo Contrast  Result Date: 12/24/2015 CLINICAL DATA:  Fall. EXAM: CT HEAD WITHOUT CONTRAST CT MAXILLOFACIAL WITHOUT CONTRAST CT CERVICAL SPINE WITHOUT CONTRAST TECHNIQUE: Multidetector CT imaging of the head, cervical spine, and maxillofacial structures were  performed using the standard protocol without intravenous contrast. Multiplanar CT image reconstructions of the cervical spine and maxillofacial structures were also generated. COMPARISON:  None. FINDINGS: CT HEAD FINDINGS Prominence of the sulci and ventricles are identified compatible with brain atrophy. There is diffuse low attenuation within the subcortical and periventricular white matter compatible with chronic microvascular disease. No abnormal extra-axial fluid collection, intracranial hemorrhage or mass identified. No evidence for acute brain infarct. The mastoid air cells appear clear. The calvarium is intact. CT MAXILLOFACIAL FINDINGS The mastoid air cells are clear. There is mild mucosal thickening involving the maxillaries sinuses. No orbital blowout fracture. The mandible is located. The visualized portions of the mandible are intact. The nasal bone is intact. The nasal septum is midline. There is a 4.5 cm hyperdense mass within the soft tissues overlying the right side of face. This is compatible with a hematoma. CT CERVICAL SPINE FINDINGS Normal alignment of the cervical spine. The vertebral body heights are well preserved. The facet joints are appear all well aligned. Multi level disc space narrowing and ventral spurring noted compatible with degenerative disc disease. No fractures or subluxations identified. IMPRESSION: 1. No acute intracranial abnormalities. Small vessel ischemic disease and brain atrophy. 2. No evidence for facial bone fracture. There is a large hematoma involving the soft tissues of the right-side of face. 3. No evidence for cervical spine fracture. There is multi level cervical degenerative disc disease Electronically Signed   By: Signa Kell M.D.   On: 12/24/2015 09:17   Ct Maxillofacial Wo Contrast  Result Date: 12/24/2015 CLINICAL DATA:  Fall. EXAM: CT HEAD WITHOUT CONTRAST CT MAXILLOFACIAL WITHOUT CONTRAST CT CERVICAL SPINE WITHOUT CONTRAST TECHNIQUE: Multidetector CT  imaging of the head, cervical spine, and maxillofacial structures were performed using the standard protocol without intravenous contrast. Multiplanar CT image reconstructions of the cervical spine and maxillofacial structures were also generated. COMPARISON:  None. FINDINGS: CT HEAD FINDINGS Prominence of the sulci and ventricles are identified compatible with  brain atrophy. There is diffuse low attenuation within the subcortical and periventricular white matter compatible with chronic microvascular disease. No abnormal extra-axial fluid collection, intracranial hemorrhage or mass identified. No evidence for acute brain infarct. The mastoid air cells appear clear. The calvarium is intact. CT MAXILLOFACIAL FINDINGS The mastoid air cells are clear. There is mild mucosal thickening involving the maxillaries sinuses. No orbital blowout fracture. The mandible is located. The visualized portions of the mandible are intact. The nasal bone is intact. The nasal septum is midline. There is a 4.5 cm hyperdense mass within the soft tissues overlying the right side of face. This is compatible with a hematoma. CT CERVICAL SPINE FINDINGS Normal alignment of the cervical spine. The vertebral body heights are well preserved. The facet joints are appear all well aligned. Multi level disc space narrowing and ventral spurring noted compatible with degenerative disc disease. No fractures or subluxations identified. IMPRESSION: 1. No acute intracranial abnormalities. Small vessel ischemic disease and brain atrophy. 2. No evidence for facial bone fracture. There is a large hematoma involving the soft tissues of the right-side of face. 3. No evidence for cervical spine fracture. There is multi level cervical degenerative disc disease Electronically Signed   By: Signa Kell M.D.   On: 12/24/2015 09:17     Subjective:   Discharge Exam: Vitals:   01/19/16 0452 01/19/16 0847  BP: (!) 110/56 (!) 107/55  Pulse: 70 74  Resp: 18 18   Temp: 97.8 F (36.6 C) 98.2 F (36.8 C)   Vitals:   01/18/16 2044 01/19/16 0452 01/19/16 0630 01/19/16 0847  BP: (!) 115/46 (!) 110/56  (!) 107/55  Pulse: 75 70  74  Resp: 18 18  18   Temp: 99.5 F (37.5 C) 97.8 F (36.6 C)  98.2 F (36.8 C)  TempSrc: Oral Axillary  Oral  SpO2: 98% 97%    Weight:   52.1 kg (114 lb 12.8 oz)   Height:        General: Pt is alert, awake, not in acute distress Cardiovascular: RRR, S1/S2 +, no rubs, no gallops Respiratory: CTA bilaterally, no wheezing, no rhonchi Abdominal: Soft, NT, ND, bowel sounds + Extremities: no edema, no cyanosis    The results of significant diagnostics from this hospitalization (including imaging, microbiology, ancillary and laboratory) are listed below for reference.     Microbiology: Recent Results (from the past 240 hour(s))  MRSA PCR Screening     Status: None   Collection Time: 01/13/16  8:12 PM  Result Value Ref Range Status   MRSA by PCR NEGATIVE NEGATIVE Final    Comment:        The GeneXpert MRSA Assay (FDA approved for NASAL specimens only), is one component of a comprehensive MRSA colonization surveillance program. It is not intended to diagnose MRSA infection nor to guide or monitor treatment for MRSA infections.   Culture, blood (routine x 2)     Status: None (Preliminary result)   Collection Time: 01/13/16 11:59 PM  Result Value Ref Range Status   Specimen Description BLOOD LEFT ANTECUBITAL  Final   Special Requests BOTTLES DRAWN AEROBIC ONLY 5CC  Final   Culture NO GROWTH 4 DAYS  Final   Report Status PENDING  Incomplete  Culture, blood (routine x 2)     Status: None (Preliminary result)   Collection Time: 01/14/16 12:08 AM  Result Value Ref Range Status   Specimen Description BLOOD LEFT HAND  Final   Special Requests IN PEDIATRIC BOTTLE 4CC  Final   Culture NO GROWTH 4 DAYS  Final   Report Status PENDING  Incomplete     Labs: BNP (last 3 results) No results for input(s): BNP in the  last 8760 hours. Basic Metabolic Panel:  Recent Labs Lab 01/13/16 1549  01/13/16 2359 01/14/16 0548 01/15/16 0248 01/16/16 0334 01/17/16 0247 01/19/16 0307  NA  --   < > 133* 135 135 133* 134* 132*  K  --   < > 4.9 4.4 4.5 3.9 3.8 4.5  CL  --   < > 100* 104 104 103 106 101  CO2  --   < > 25 24 25 23 23 23   GLUCOSE  --   < > 159* 99 98 114* 125* 102*  BUN  --   < > 42* 37* 35* 29* 24* 22*  CREATININE  --   < > 1.29* 1.17* 1.11* 1.23* 1.22* 1.25*  CALCIUM  --   < > 8.7* 8.3* 8.7* 8.5* 7.8* 8.5*  MG 1.8  --  1.5* 1.5* 2.2 1.6*  --   --   PHOS  --   --  3.9 3.6 4.1 3.2  --   --   < > = values in this interval not displayed. Liver Function Tests:  Recent Labs Lab 01/13/16 1700 01/13/16 2359 01/15/16 0248 01/16/16 0334 01/17/16 0247  AST 141* 106* 52* 32 23  ALT 146* 143* 91* 51 23  ALKPHOS 49 48 45 44 43  BILITOT 0.6 1.0 0.7 0.5 0.5  PROT 6.7 7.3 6.6 6.4* 5.5*  ALBUMIN 3.3* 3.4* 3.1* 3.1* 2.6*   No results for input(s): LIPASE, AMYLASE in the last 168 hours. No results for input(s): AMMONIA in the last 168 hours. CBC:  Recent Labs Lab 01/13/16 1549  01/15/16 0248 01/16/16 0334 01/17/16 0247 01/17/16 0902 01/18/16 0542 01/18/16 0811 01/19/16 0307  WBC 18.9*  < > 11.0* 14.3* 10.7*  --   --  12.8* 12.5*  NEUTROABS 15.2*  --   --   --   --   --   --   --   --   HGB 12.2  < > 10.1* 9.8* 8.3* 8.9* 8.8* 9.5* 8.8*  HCT 36.1  < > 31.5* 30.0* 25.4* 27.9* 27.0* 29.7* 26.8*  MCV 96.1  < > 97.5 96.5 95.8  --   --  97.7 96.8  PLT 293  < > 192 202 166  --   --  199 201  < > = values in this interval not displayed. Cardiac Enzymes:  Recent Labs Lab 01/13/16 1549 01/13/16 2359 01/14/16 0548 01/14/16 1224  TROPONINI 0.03* 0.04* 0.05* 0.04*   BNP: Invalid input(s): POCBNP CBG:  Recent Labs Lab 01/18/16 1134 01/18/16 1629 01/18/16 2107 01/19/16 0736 01/19/16 1125  GLUCAP 103* 139* 160* 101* 115*   D-Dimer No results for input(s): DDIMER in the last 72  hours. Hgb A1c No results for input(s): HGBA1C in the last 72 hours. Lipid Profile No results for input(s): CHOL, HDL, LDLCALC, TRIG, CHOLHDL, LDLDIRECT in the last 72 hours. Thyroid function studies No results for input(s): TSH, T4TOTAL, T3FREE, THYROIDAB in the last 72 hours.  Invalid input(s): FREET3 Anemia work up  Recent Labs  01/17/16 1012  VITAMINB12 324  FOLATE 25.6  FERRITIN 1,450*  TIBC 155*  IRON 23*  RETICCTPCT 2.8   Urinalysis    Component Value Date/Time   COLORURINE YELLOW 01/18/2016 1120   APPEARANCEUR CLEAR 01/18/2016 1120   APPEARANCEUR Hazy 09/17/2013 0743  LABSPEC 1.012 01/18/2016 1120   LABSPEC 1.014 09/17/2013 0743   PHURINE 6.5 01/18/2016 1120   GLUCOSEU NEGATIVE 01/18/2016 1120   GLUCOSEU Negative 09/17/2013 0743   HGBUR SMALL (A) 01/18/2016 1120   BILIRUBINUR NEGATIVE 01/18/2016 1120   BILIRUBINUR Negative 09/17/2013 0743   KETONESUR NEGATIVE 01/18/2016 1120   PROTEINUR NEGATIVE 01/18/2016 1120   NITRITE NEGATIVE 01/18/2016 1120   LEUKOCYTESUR NEGATIVE 01/18/2016 1120   LEUKOCYTESUR Negative 09/17/2013 0743   Sepsis Labs Invalid input(s): PROCALCITONIN,  WBC,  LACTICIDVEN Microbiology Recent Results (from the past 240 hour(s))  MRSA PCR Screening     Status: None   Collection Time: 01/13/16  8:12 PM  Result Value Ref Range Status   MRSA by PCR NEGATIVE NEGATIVE Final    Comment:        The GeneXpert MRSA Assay (FDA approved for NASAL specimens only), is one component of a comprehensive MRSA colonization surveillance program. It is not intended to diagnose MRSA infection nor to guide or monitor treatment for MRSA infections.   Culture, blood (routine x 2)     Status: None (Preliminary result)   Collection Time: 01/13/16 11:59 PM  Result Value Ref Range Status   Specimen Description BLOOD LEFT ANTECUBITAL  Final   Special Requests BOTTLES DRAWN AEROBIC ONLY 5CC  Final   Culture NO GROWTH 4 DAYS  Final   Report Status PENDING   Incomplete  Culture, blood (routine x 2)     Status: None (Preliminary result)   Collection Time: 01/14/16 12:08 AM  Result Value Ref Range Status   Specimen Description BLOOD LEFT HAND  Final   Special Requests IN PEDIATRIC BOTTLE 4CC  Final   Culture NO GROWTH 4 DAYS  Final   Report Status PENDING  Incomplete     Time coordinating discharge: Over 30 minutes  SIGNED:   Alba Cory, MD  [facility] 01/19/2016, 12:34 PM Pager [phone]  If 7PM-7AM, please contact night-coverage [email or web address] Password [email or web address]

## 2016-01-22 ENCOUNTER — Encounter: Payer: Self-pay | Admitting: Cardiology

## 2016-01-22 ENCOUNTER — Ambulatory Visit (INDEPENDENT_AMBULATORY_CARE_PROVIDER_SITE_OTHER): Payer: Medicare Other | Admitting: *Deleted

## 2016-01-22 DIAGNOSIS — I459 Conduction disorder, unspecified: Secondary | ICD-10-CM

## 2016-01-22 LAB — CUP PACEART INCLINIC DEVICE CHECK
Battery Remaining Longevity: 123.6
Battery Voltage: 3.07 V
Implantable Lead Implant Date: 20170829
Implantable Lead Location: 753859
Lead Channel Impedance Value: 425 Ohm
Lead Channel Pacing Threshold Amplitude: 0.75 V
Lead Channel Pacing Threshold Amplitude: 0.75 V
Lead Channel Pacing Threshold Pulse Width: 0.4 ms
Lead Channel Pacing Threshold Pulse Width: 0.4 ms
Lead Channel Pacing Threshold Pulse Width: 0.4 ms
Lead Channel Sensing Intrinsic Amplitude: 1 mV
Lead Channel Setting Pacing Amplitude: 0.875
MDC IDC LEAD IMPLANT DT: 20170829
MDC IDC LEAD LOCATION: 753860
MDC IDC MSMT LEADCHNL RA PACING THRESHOLD AMPLITUDE: 0.75 V
MDC IDC MSMT LEADCHNL RA PACING THRESHOLD PULSEWIDTH: 0.4 ms
MDC IDC MSMT LEADCHNL RV IMPEDANCE VALUE: 587.5 Ohm
MDC IDC MSMT LEADCHNL RV PACING THRESHOLD AMPLITUDE: 0.75 V
MDC IDC MSMT LEADCHNL RV SENSING INTR AMPL: 12 mV
MDC IDC SESS DTM: 20170905154728
MDC IDC SET LEADCHNL RA PACING AMPLITUDE: 1.75 V
MDC IDC SET LEADCHNL RV PACING PULSEWIDTH: 0.4 ms
MDC IDC SET LEADCHNL RV SENSING SENSITIVITY: 2 mV
MDC IDC STAT BRADY RA PERCENT PACED: 29 %
MDC IDC STAT BRADY RV PERCENT PACED: 34 %
Pulse Gen Serial Number: 7941913

## 2016-01-22 NOTE — Progress Notes (Signed)
Wound check appointment. Steri-strips removed. Wound without redness or edema. Incision edges approximated, wound well healed. Small hematoma noted--slightly firm to palpation, ecchymosis is now yellow/green. Dr.Camnitz assessed wound and recommended no changes. Patient/daughter was encouraged to call if site becomes larger.  Normal device function. Thresholds, sensing, and impedances consistent with implant measurements. RA/RV auto capture programmed on at implant. Histogram distribution appropriate for patient and level of activity. No mode switches or high ventricular rates noted. Patient educated about wound care, arm mobility, lifting restrictions. ROV in 3 months with WC.

## 2016-01-28 ENCOUNTER — Ambulatory Visit: Payer: Medicare Other

## 2016-03-21 ENCOUNTER — Ambulatory Visit (INDEPENDENT_AMBULATORY_CARE_PROVIDER_SITE_OTHER): Payer: Medicare Other | Admitting: *Deleted

## 2016-03-21 ENCOUNTER — Encounter: Payer: Self-pay | Admitting: Cardiology

## 2016-03-21 DIAGNOSIS — Z950 Presence of cardiac pacemaker: Secondary | ICD-10-CM

## 2016-03-21 DIAGNOSIS — I459 Conduction disorder, unspecified: Secondary | ICD-10-CM

## 2016-03-21 LAB — CUP PACEART INCLINIC DEVICE CHECK
Battery Remaining Longevity: 86 mo
Date Time Interrogation Session: 20171103160514
Implantable Lead Location: 753859
Implantable Lead Location: 753860
Lead Channel Impedance Value: 587.5 Ohm
Lead Channel Pacing Threshold Amplitude: 0.5 V
Lead Channel Setting Pacing Pulse Width: 0.4 ms
Lead Channel Setting Sensing Sensitivity: 2 mV
MDC IDC LEAD IMPLANT DT: 20170829
MDC IDC LEAD IMPLANT DT: 20170829
MDC IDC MSMT BATTERY VOLTAGE: 3.02 V
MDC IDC MSMT LEADCHNL RA IMPEDANCE VALUE: 412.5 Ohm
MDC IDC MSMT LEADCHNL RA SENSING INTR AMPL: 0.6 mV
MDC IDC MSMT LEADCHNL RV PACING THRESHOLD PULSEWIDTH: 0.4 ms
MDC IDC PG IMPLANT DT: 20170829
MDC IDC PG SERIAL: 7941913
MDC IDC SET LEADCHNL RA PACING AMPLITUDE: 3.5 V
MDC IDC SET LEADCHNL RV PACING AMPLITUDE: 3.5 V
MDC IDC STAT BRADY RA PERCENT PACED: 30 %
MDC IDC STAT BRADY RV PERCENT PACED: 56 %
Pulse Gen Model: 2272

## 2016-03-21 NOTE — Progress Notes (Signed)
Pacemaker check in clinic. Normal device function. Thresholds, sensing, impedances consistent with previous measurements. Device programmed to maximize longevity. 401 mode switches (8.1%), +no OAC at this time. No high ventricular rates noted. Device programmed at appropriate safety margins; RA and RV outputs fixed at 3.5V, ACap Confirm and V AutoCapture turned off due to out of range values. RA sensitivity reprogrammed to 0.32mV due to undersensed AFl. Histogram distribution appropriate for patient activity level. Device programmed to optimize intrinsic conduction. Estimated longevity 9.6-10.3 years. Patient enrolled in remote follow-up. Patient education completed. ROV with WC on 03/27/16 to discuss OAC.

## 2016-03-25 NOTE — Progress Notes (Signed)
Electrophysiology Office Note   Date:  03/27/2016   ID:  Diana Braun, DOB [birth year], MRN [id number]  PCP:  Barbette ReichmannHANDE,VISHWANATH, MD  Primary Electrophysiologist:  Regan LemmingWill Martin Mcihael Hinderman, MD    Chief Complaint  Patient presents with  . Pacemaker Check     History of Present Illness: Diana Braun is a 82 y.o. female who presents today for electrophysiology evaluation.   History of complete AV block s/p pacemaker 01/15/16. On interrogation was found to be in atrial flutter.Currently, she is feeling well without major. She is in complete heart block and does not know she is in atrial flutter. She has healed well from her pacemaker and her fall after being found in complete heart block. She has had no other issues with falls. She is planning to [place] in [place] for a few months.    Today, she denies symptoms of palpitations, chest pain, shortness of breath, orthopnea, PND, lower extremity edema, claudication, dizziness, presyncope, syncope, bleeding, or neurologic sequela. The patient is tolerating medications without difficulties and is otherwise without complaint today.    Past Medical History:  Diagnosis Date  . Anemia   . Cardiac pacemaker 01/15/2016  . Complete heart block (HCC) 01/15/2016  . Diabetes mellitus without complication (HCC)   . Hyperlipidemia   . Hypertension   . Seizures (HCC)   . Thyroid disease    Past Surgical History:  Procedure Laterality Date  . CARDIAC CATHETERIZATION N/A 01/13/2016   Procedure: Temporary Pacemaker;  Surgeon: Peter M SwazilandJordan, MD;  [facility];  Service: Cardiovascular;  Laterality: N/A;  . EP IMPLANTABLE DEVICE N/A 01/15/2016   Procedure: Pacemaker Implant;  Surgeon: Weltha Cathy Jorja LoaMartin Danya Spearman, MD;  Location: [facility] INVASIVE CV LAB;  Service: Cardiovascular;  Laterality: N/A;  . HIP SURGERY Right   . KNEE SURGERY Right      Current Outpatient Prescriptions  Medication Sig Dispense Refill  . acetaminophen (TYLENOL) 500  MG tablet Take 500 mg by mouth every 6 (six) hours as needed (pain).    . famotidine (PEPCID) 20 MG tablet Take 1 tablet (20 mg total) by mouth daily. 30 tablet 0  . ferrous sulfate 325 (65 FE) MG tablet Take 1 tablet (325 mg total) by mouth 2 (two) times daily with a meal. 60 tablet 1  . levothyroxine (SYNTHROID, LEVOTHROID) 88 MCG tablet Take by mouth.    . meclizine (ANTIVERT) 12.5 MG tablet Take 1 tablet (12.5 mg total) by mouth 3 (three) times daily as needed for dizziness. 30 tablet 0  . triamcinolone cream (KENALOG) 0.1 % Apply 1 application topically 2 (two) times daily as needed (rash).    . Rivaroxaban (XARELTO) 15 MG TABS tablet Take 1 tablet (15 mg total) by mouth daily with supper. 30 tablet 0  . Rivaroxaban (XARELTO) 15 MG TABS tablet Take 1 tablet (15 mg total) by mouth daily with supper. 90 tablet 2   No current facility-administered medications for this visit.     Allergies:   Patient has no known allergies.   Social History:  The patient  reports that she has never smoked. She has never used smokeless tobacco. She reports that she does not drink alcohol or use drugs.   Family History:  The patient's family history includes Diabetes in her brother, brother, and mother; Stroke in her brother.    ROS:  Please see the history of present illness.   Otherwise, review of systems is positive for chills, hearing loss, cough, constipation.  All other systems are reviewed and negative.    PHYSICAL EXAM: VS:  BP (!) 122/50   Pulse 95   Ht 5' (1.524 m)   Wt 121 lb 6.4 oz (55.1 kg)   SpO2 98%   BMI 23.71 kg/m  , BMI Body mass index is 23.71 kg/m. GEN: Well nourished, well developed, in no acute distress  HEENT: normal  Neck: no JVD, carotid bruits, or masses Cardiac: RRR; no murmurs, rubs, or gallops,no edema  Respiratory:  clear to auscultation bilaterally, normal work of breathing GI: soft, nontender, nondistended, + BS MS: no deformity or atrophy  Skin: warm and dry,   device pocket is well healed Neuro:  Strength and sensation are intact Psych: euthymic mood, full affect  EKG:  EKG is not ordered today. Personal review of the ekg ordered shows A sense V pace   Device interrogation is reviewed today in detail.  See PaceArt for details.   Recent Labs: 01/13/2016: TSH 0.705 01/16/2016: Magnesium 1.6 01/17/2016: ALT 23 01/19/2016: BUN 22; Creatinine, Ser 1.25; Hemoglobin 8.8; Platelets 201; Potassium 4.5; Sodium 132    Lipid Panel     Component Value Date/Time   CHOL 122 01/14/2016 1224   CHOL 121 05/26/2013 0453   TRIG 86 01/14/2016 1224   TRIG 163 05/26/2013 0453   HDL 48 01/14/2016 1224   HDL 26 (L) 05/26/2013 0453   CHOLHDL 2.5 01/14/2016 1224   VLDL 17 01/14/2016 1224   VLDL 33 05/26/2013 0453   LDLCALC 57 01/14/2016 1224   LDLCALC 62 05/26/2013 0453     Wt Readings from Last 3 Encounters:  03/27/16 121 lb 6.4 oz (55.1 kg)  01/19/16 114 lb 12.8 oz (52.1 kg)  01/13/16 118 lb (53.5 kg)      Other studies Reviewed: Additional studies/ records that were reviewed today include: TTE 12/24/15  Review of the above records today demonstrates:  - Left ventricle: The cavity size was mildly dilated. Systolic   function was normal. The estimated ejection fraction was 60%.   Wall motion was normal; there were no regional wall motion   abnormalities. The study is not technically sufficient to allow   evaluation of LV diastolic function. - Aortic valve: There was trivial regurgitation. - Mitral valve: There was mild regurgitation. - Left atrium: The atrium was mildly dilated. - Right ventricle: The cavity size was mildly dilated. - Atrial septum: No defect or patent foramen ovale was identified. - Tricuspid valve: There was severe regurgitation. - Pulmonary arteries: PA peak pressure: 45 mm Hg (S).   ASSESSMENT AND PLAN:  1.  Complete AV block: Pacemaker is currently functioning appropriately. No changes were made to the pacemaker today. She  had had under sensing past, but her sensitivities were changed and she is sensing just fine today.  2. Atrial flutter: Found incidentally on pacemaker interrogation. I discussed with both her and her daughter her risk of falls and bleeding. Do not feel like her risk is high of 2 ovoid anticoagulation. We'll therefore start her on Zarontin 15 mg today.  This patients CHA2DS2-VASc Score and unadjusted Ischemic Stroke Rate (% per year) is equal to 4.8 % stroke rate/year from a score of 4  Above score calculated as 1 point each if present [CHF, HTN, DM, Vascular=MI/PAD/Aortic Plaque, Age if 65-74, or Female] Above score calculated as 2 points each if present [Age > 75, or Stroke/TIA/TE]  3. Hypertension: Currently well controlled. Not on antihypertensive medications  Current medicines are reviewed at length with  the patient today.   The patient does not have concerns regarding her medicines.  The following changes were made today:  Xarleto  Labs/ tests ordered today include:  No orders of the defined types were placed in this encounter.    Disposition:   FU with Dakari Stabler 2 months  Signed, Wade Sigala Jorja LoaMartin Cielo Arias, MD  03/27/2016 8:37 AM     [facility] [address] [place] [address] [phone] (office) [phone] (fax)

## 2016-03-27 ENCOUNTER — Encounter: Payer: Self-pay | Admitting: Cardiology

## 2016-03-27 ENCOUNTER — Ambulatory Visit (INDEPENDENT_AMBULATORY_CARE_PROVIDER_SITE_OTHER): Payer: Medicare Other | Admitting: Cardiology

## 2016-03-27 VITALS — BP 122/50 | HR 95 | Ht 60.0 in | Wt 121.4 lb

## 2016-03-27 DIAGNOSIS — I483 Typical atrial flutter: Secondary | ICD-10-CM

## 2016-03-27 MED ORDER — RIVAROXABAN 15 MG PO TABS: 15.0000 mg | ORAL_TABLET | Freq: Every day | ORAL | 0 refills | Status: DC

## 2016-03-27 MED ORDER — RIVAROXABAN 15 MG PO TABS: 15.0000 mg | ORAL_TABLET | Freq: Every day | ORAL | 2 refills | Status: DC

## 2016-03-27 NOTE — Patient Instructions (Signed)
Medication Instructions:  Your physician has recommended you make the following change in your medication:  1) START Xarelto 15 mg once daily  Labwork: None ordered  Testing/Procedures: None ordered  Follow-Up: Your physician recommends that you schedule a follow-up appointment in: end of December with Dr. Elberta Fortisamnitz.  If you need a refill on your cardiac medications before your next appointment, please call your pharmacy.  Thank you for choosing [facility]!!   Dory HornSherri Kamil Mchaffie, RN [phone]   Any Other Special Instructions Will Be Listed Below (If Applicable).  Rivaroxaban oral tablets What is this medicine? RIVAROXABAN (ri va ROX a ban) is an anticoagulant (blood thinner). It is used to treat blood clots in the lungs or in the veins. It is also used after knee or hip surgeries to prevent blood clots. It is also used to lower the chance of stroke in people with a medical condition called atrial fibrillation. This medicine may be used for other purposes; ask your health care provider or pharmacist if you have questions. What should I tell my health care provider before I take this medicine? They need to know if you have any of these conditions: -bleeding disorders -bleeding in the brain -blood in your stools (black or tarry stools) or if you have blood in your vomit -history of stomach bleeding -kidney disease -liver disease -low blood counts, like low white cell, platelet, or red cell counts -recent or planned spinal or epidural procedure -take medicines that treat or prevent blood clots -an unusual or allergic reaction to rivaroxaban, other medicines, foods, dyes, or preservatives -pregnant or trying to get pregnant -breast-feeding How should I use this medicine? Take this medicine by mouth with a glass of water. Follow the directions on the prescription label. Take your medicine at regular intervals. Do not take it more often than directed. Do not stop taking except on  your doctor's advice. Stopping this medicine may increase your risk of a blood clot. Be sure to refill your prescription before you run out of medicine. If you are taking this medicine after hip or knee replacement surgery, take it with or without food. If you are taking this medicine for atrial fibrillation, take it with your evening meal. If you are taking this medicine to treat blood clots, take it with food at the same time each day. If you are unable to swallow your tablet, you may crush the tablet and mix it in applesauce. Then, immediately eat the applesauce. You should eat more food right after you eat the applesauce containing the crushed tablet. Talk to your pediatrician regarding the use of this medicine in children. Special care may be needed. Overdosage: If you think you have taken too much of this medicine contact a poison control center or emergency room at once. NOTE: This medicine is only for you. Do not share this medicine with others. What if I miss a dose? If you take your medicine once a day and miss a dose, take the missed dose as soon as you remember. If you take your medicine twice a day and miss a dose, take the missed dose immediately. In this instance, 2 tablets may be taken at the same time. The next day you should take 1 tablet twice a day as directed. What may interact with this medicine? -aspirin and aspirin-like medicines -certain antibiotics like erythromycin, azithromycin, and clarithromycin -certain medicines for fungal infections like ketoconazole and itraconazole -certain medicines for irregular heart beat like amiodarone, quinidine, dronedarone -certain medicines for  seizures like carbamazepine, phenytoin -certain medicines that treat or prevent blood clots like warfarin, enoxaparin, and dalteparin -conivaptan -diltiazem -felodipine -indinavir -lopinavir; ritonavir -NSAIDS, medicines for pain and inflammation, like ibuprofen or  naproxen -ranolazine -rifampin -ritonavir -St. John's wort -verapamil This list may not describe all possible interactions. Give your health care provider a list of all the medicines, herbs, non-prescription drugs, or dietary supplements you use. Also tell them if you smoke, drink alcohol, or use illegal drugs. Some items may interact with your medicine. What should I watch for while using this medicine? Visit your doctor or health care professional for regular checks on your progress. Your condition will be monitored carefully while you are receiving this medicine. Notify your doctor or health care professional and seek emergency treatment if you develop breathing problems; changes in vision; chest pain; severe, sudden headache; pain, swelling, warmth in the leg; trouble speaking; sudden numbness or weakness of the face, arm, or leg. These can be signs that your condition has gotten worse. If you are going to have surgery, tell your doctor or health care professional that you are taking this medicine. Tell your health care professional that you use this medicine before you have a spinal or epidural procedure. Sometimes people who take this medicine have bleeding problems around the spine when they have a spinal or epidural procedure. This bleeding is very rare. If you have a spinal or epidural procedure while on this medicine, call your health care professional immediately if you have back pain, numbness or tingling (especially in your legs and feet), muscle weakness, paralysis, or loss of bladder or bowel control. Avoid sports and activities that might cause injury while you are using this medicine. Severe falls or injuries can cause unseen bleeding. Be careful when using sharp tools or knives. Consider using an Neurosurgeonelectric razor. Take special care brushing or flossing your teeth. Report any injuries, bruising, or red spots on the skin to your doctor or health care professional. What side effects may I  notice from receiving this medicine? Side effects that you should report to your doctor or health care professional as soon as possible: -allergic reactions like skin rash, itching or hives, swelling of the face, lips, or tongue -back pain -redness, blistering, peeling or loosening of the skin, including inside the mouth -signs and symptoms of bleeding such as bloody or black, tarry stools; red or dark-brown urine; spitting up blood or brown material that looks like coffee grounds; red spots on the skin; unusual bruising or bleeding from the eye, gums, or nose Side effects that usually do not require medical attention (Report these to your doctor or health care professional if they continue or are bothersome.): -dizziness -muscle pain This list may not describe all possible side effects. Call your doctor for medical advice about side effects. You may report side effects to FDA at 1-800-FDA-1088. Where should I keep my medicine? Keep out of the reach of children. Store at room temperature between 15 and 30 degrees C (59 and 86 degrees F). Throw away any unused medicine after the expiration date. NOTE: This sheet is a summary. It may not cover all possible information. If you have questions about this medicine, talk to your doctor, pharmacist, or health care provider.    2016, [employer]vier/Gold Standard. (2014-05-03 12:45:34)

## 2016-04-17 ENCOUNTER — Encounter: Payer: Medicare Other | Admitting: Cardiology

## 2016-04-28 ENCOUNTER — Ambulatory Visit (INDEPENDENT_AMBULATORY_CARE_PROVIDER_SITE_OTHER): Payer: Medicare Other | Admitting: Cardiology

## 2016-04-28 ENCOUNTER — Encounter: Payer: Self-pay | Admitting: Cardiology

## 2016-04-28 VITALS — BP 122/60 | HR 67 | Ht <= 58 in | Wt 119.0 lb

## 2016-04-28 DIAGNOSIS — I483 Typical atrial flutter: Secondary | ICD-10-CM | POA: Diagnosis not present

## 2016-04-28 DIAGNOSIS — I442 Atrioventricular block, complete: Secondary | ICD-10-CM

## 2016-04-28 DIAGNOSIS — Z950 Presence of cardiac pacemaker: Secondary | ICD-10-CM | POA: Diagnosis not present

## 2016-04-28 LAB — CUP PACEART INCLINIC DEVICE CHECK
Battery Voltage: 3.01 V
Date Time Interrogation Session: 20171211141459
Implantable Lead Implant Date: 20170829
Implantable Lead Location: 753859
Implantable Lead Location: 753860
Implantable Pulse Generator Implant Date: 20170829
Lead Channel Pacing Threshold Amplitude: 0.625 V
Lead Channel Sensing Intrinsic Amplitude: 11.5 mV
Lead Channel Setting Pacing Amplitude: 0.875
Lead Channel Setting Pacing Pulse Width: 0.4 ms
Lead Channel Setting Sensing Sensitivity: 4 mV
MDC IDC LEAD IMPLANT DT: 20170829
MDC IDC MSMT LEADCHNL RA IMPEDANCE VALUE: 400 Ohm
MDC IDC MSMT LEADCHNL RA PACING THRESHOLD AMPLITUDE: 0.75 V
MDC IDC MSMT LEADCHNL RA PACING THRESHOLD PULSEWIDTH: 0.4 ms
MDC IDC MSMT LEADCHNL RA SENSING INTR AMPL: 1.1 mV
MDC IDC MSMT LEADCHNL RV IMPEDANCE VALUE: 600 Ohm
MDC IDC MSMT LEADCHNL RV PACING THRESHOLD PULSEWIDTH: 0.4 ms
MDC IDC SET LEADCHNL RA PACING AMPLITUDE: 2 V
MDC IDC STAT BRADY RA PERCENT PACED: 18 %
MDC IDC STAT BRADY RV PERCENT PACED: 96 %
Pulse Gen Model: 2272
Pulse Gen Serial Number: 7941913

## 2016-04-28 MED ORDER — RIVAROXABAN 15 MG PO TABS: 15.0000 mg | ORAL_TABLET | Freq: Every day | ORAL | 1 refills | Status: AC

## 2016-04-28 NOTE — Patient Instructions (Addendum)
Medication Instructions:  Your physician recommends that you continue on your current medications as directed. Please refer to the Current Medication list given to you today.  * If you need a refill on your cardiac medications before your next appointment, please call your pharmacy.   Labwork: None ordered   Testing/Procedures: None ordered  Follow-Up: Your physician wants you to follow-up in: 9 months with Dr. Camnitz.  You will receive a reminder letter in the mail two months in advance. If you don't receive a letter, please call our office to schedule the follow-up appointment.  Thank you for choosing [facility]!!   Jarvis Sawa, RN [phone]       

## 2016-04-28 NOTE — Progress Notes (Signed)
Electrophysiology Office Note   Date:  04/28/2016   ID:  Diana Kungva Odowd, DOB 1923-01-21, MRN [id number]  PCP:  Barbette ReichmannHANDE,VISHWANATH, MD  Primary Electrophysiologist:  Regan LemmingWill Martin Camnitz, MD    Chief Complaint  Patient presents with  . Pacemaker Check    Complete heart block     History of Present Illness: Diana Braun is a 82 y.o. female who presents today for electrophysiology evaluation.   History of complete AV block s/p pacemaker 01/15/16. On interrogation was found to be in atrial flutter. Started on Xarelto at her last visit.   Today, she denies symptoms of palpitations, chest pain, shortness of breath, orthopnea, PND, lower extremity edema, claudication, dizziness, presyncope, syncope, bleeding, or neurologic sequela. The patient is tolerating medications without difficulties and is otherwise without complaint today.    Past Medical History:  Diagnosis Date  . Anemia   . Cardiac pacemaker 01/15/2016  . Complete heart block (HCC) 01/15/2016  . Diabetes mellitus without complication (HCC)   . Hyperlipidemia   . Hypertension   . Seizures (HCC)   . Thyroid disease    Past Surgical History:  Procedure Laterality Date  . CARDIAC CATHETERIZATION N/A 01/13/2016   Procedure: Temporary Pacemaker;  Surgeon: Peter M SwazilandJordan, MD;  [facility];  Service: Cardiovascular;  Laterality: N/A;  . EP IMPLANTABLE DEVICE N/A 01/15/2016   Procedure: Pacemaker Implant;  Surgeon: Will Jorja LoaMartin Camnitz, MD;  Location: [facility] CV [facility];  Service: Cardiovascular;  Laterality: N/A;  . HIP SURGERY Right   . KNEE SURGERY Right      Current Outpatient Prescriptions  Medication Sig Dispense Refill  . acetaminophen (TYLENOL) 500 MG tablet Take 500 mg by mouth every 6 (six) hours as needed (pain).    . famotidine (PEPCID) 20 MG tablet Take 1 tablet (20 mg total) by mouth daily. 30 tablet 0  . ferrous sulfate 325 (65 FE) MG tablet Take 1 tablet (325 mg total) by mouth 2 (two) times daily  with a meal. 60 tablet 1  . levothyroxine (SYNTHROID, LEVOTHROID) 88 MCG tablet Take by mouth.    . meclizine (ANTIVERT) 12.5 MG tablet Take 1 tablet (12.5 mg total) by mouth 3 (three) times daily as needed for dizziness. 30 tablet 0  . Rivaroxaban (XARELTO) 15 MG TABS tablet Take 1 tablet (15 mg total) by mouth daily with supper. 90 tablet 1  . triamcinolone cream (KENALOG) 0.1 % Apply 1 application topically 2 (two) times daily as needed (rash).     No current facility-administered medications for this visit.     Allergies:   Patient has no known allergies.   Social History:  The patient  reports that she has never smoked. She has never used smokeless tobacco. She reports that she does not drink alcohol or use drugs.   Family History:  The patient's family history includes Diabetes in her brother, brother, and mother; Stroke in her brother.    ROS:  Please see the history of present illness.   Otherwise, review of systems is positive for hearing loss, cough.   All other systems are reviewed and negative.    PHYSICAL EXAM: VS:  BP 122/60   Pulse 67   Ht 4\' 9"  (1.448 m)   Wt 119 lb (54 kg)   BMI 25.75 kg/m  , BMI Body mass index is 25.75 kg/m. GEN: Well nourished, well developed, in no acute distress  HEENT: normal  Neck: no JVD, carotid bruits, or masses Cardiac: RRR; no murmurs,  rubs, or gallops,no edema  Respiratory:  clear to auscultation bilaterally, normal work of breathing GI: soft, nontender, nondistended, + BS MS: no deformity or atrophy  Skin: warm and dry,  device pocket is well healed Neuro:  Strength and sensation are intact Psych: euthymic mood, full affect  EKG:  EKG is ordered today. Personal review of the ekg ordered shows A sense V pace   Device interrogation is reviewed today in detail.  See PaceArt for details.   Recent Labs: 01/13/2016: TSH 0.705 01/16/2016: Magnesium 1.6 01/17/2016: ALT 23 01/19/2016: BUN 22; Creatinine, Ser 1.25; Hemoglobin 8.8;  Platelets 201; Potassium 4.5; Sodium 132    Lipid Panel     Component Value Date/Time   CHOL 122 01/14/2016 1224   CHOL 121 05/26/2013 0453   TRIG 86 01/14/2016 1224   TRIG 163 05/26/2013 0453   HDL 48 01/14/2016 1224   HDL 26 (L) 05/26/2013 0453   CHOLHDL 2.5 01/14/2016 1224   VLDL 17 01/14/2016 1224   VLDL 33 05/26/2013 0453   LDLCALC 57 01/14/2016 1224   LDLCALC 62 05/26/2013 0453     Wt Readings from Last 3 Encounters:  04/28/16 119 lb (54 kg)  03/27/16 121 lb 6.4 oz (55.1 kg)  01/19/16 114 lb 12.8 oz (52.1 kg)      Other studies Reviewed: Additional studies/ records that were reviewed today include: TTE 12/24/15  Review of the above records today demonstrates:  - Left ventricle: The cavity size was mildly dilated. Systolic   function was normal. The estimated ejection fraction was 60%.   Wall motion was normal; there were no regional wall motion   abnormalities. The study is not technically sufficient to allow   evaluation of LV diastolic function. - Aortic valve: There was trivial regurgitation. - Mitral valve: There was mild regurgitation. - Left atrium: The atrium was mildly dilated. - Right ventricle: The cavity size was mildly dilated. - Atrial septum: No defect or patent foramen ovale was identified. - Tricuspid valve: There was severe regurgitation. - Pulmonary arteries: PA peak pressure: 45 mm Hg (S).   ASSESSMENT AND PLAN:  1.  Complete AV block: Pacemaker is currently functioning appropriately. No changes today.  2. Atrial flutter: Found incidentally on pacemaker interrogation. On Xarelto at the last visit.  This patients CHA2DS2-VASc Score and unadjusted Ischemic Stroke Rate (% per year) is equal to 4.8 % stroke rate/year from a score of 4  Above score calculated as 1 point each if present [CHF, HTN, DM, Vascular=MI/PAD/Aortic Plaque, Age if 65-74, or Female] Above score calculated as 2 points each if present [Age > 75, or Stroke/TIA/TE]  3.  Hypertension: Currently well controlled. Not on antihypertensive medications  Current medicines are reviewed at length with the patient today.   The patient does not have concerns regarding her medicines.  The following changes were made today:  Noel ChristmasXarleto  Labs/ tests ordered today include:  Orders Placed This Encounter  Procedures  . EKG 12-Lead     Disposition:   FU with Will Camnitz 2 months  Signed, Will Jorja LoaMartin Camnitz, MD  04/28/2016 10:05 AM     [facility] [address] [place] [address] [phone] (office) [phone] (fax)

## 2017-11-27 ENCOUNTER — Encounter: Payer: Self-pay | Admitting: Cardiology

## 2018-01-28 ENCOUNTER — Other Ambulatory Visit: Payer: Self-pay | Admitting: *Deleted

## 2018-01-28 NOTE — Progress Notes (Signed)
error 

## 2018-07-05 IMAGING — CT CT HEAD W/O CM
3 series · 15 of 45 positions shown, 18 images · non-contrast
Comparison: 01/04/2016

CLINICAL DATA: Seizures.

EXAM:
CT HEAD WITHOUT CONTRAST
TECHNIQUE: Contiguous axial images were obtained from the base of the skull
through the vertex without intravenous contrast.

[Series 2: head wo · axial · 0.39mm/px · z∈[-128,-13]mm · 9 of 28 slices shown, 12 images]
[im 3/28  brain]
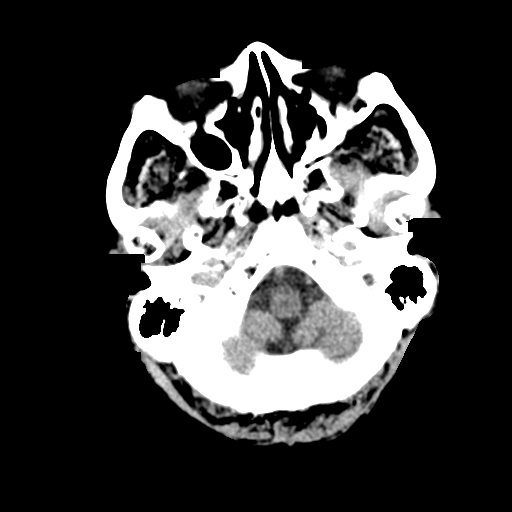
[im 3/28  bone]
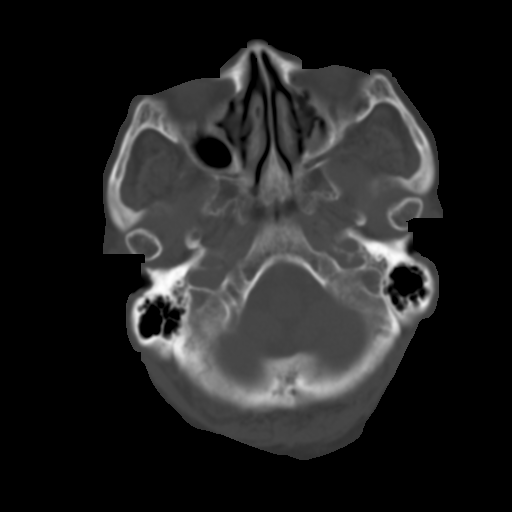
[im 6/28  brain]
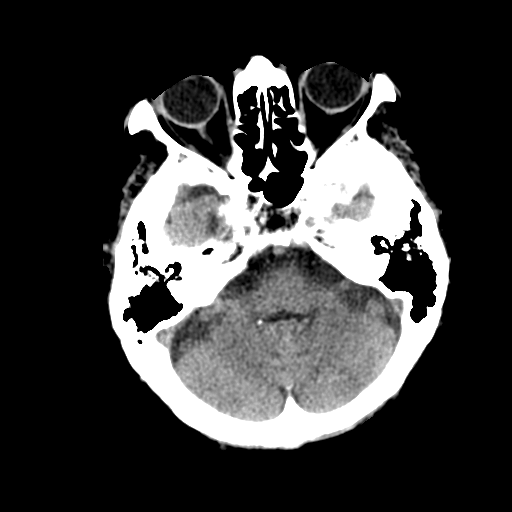
[im 9/28  brain]
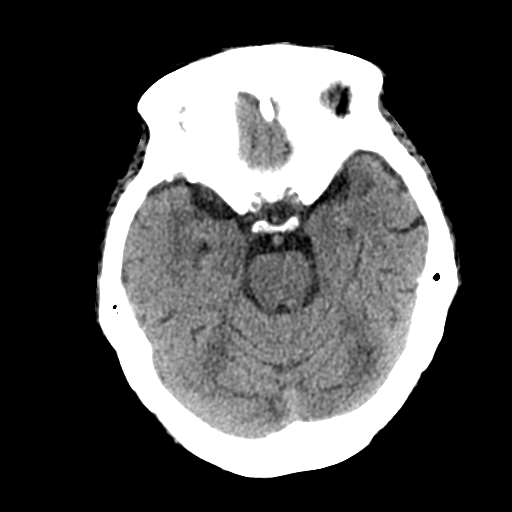
[im 12/28  brain]
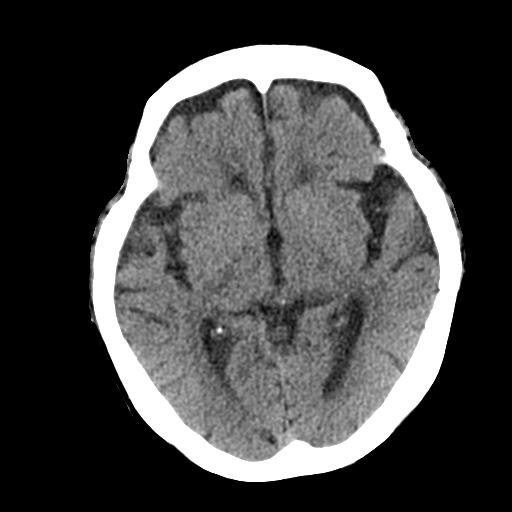
[im 15/28  brain]
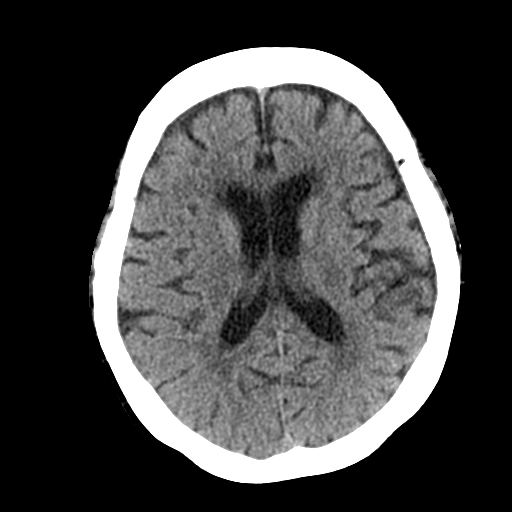
[im 15/28  bone]
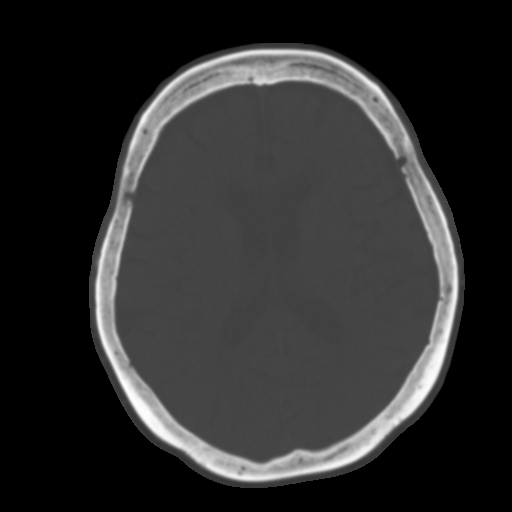
[im 17/28  brain]
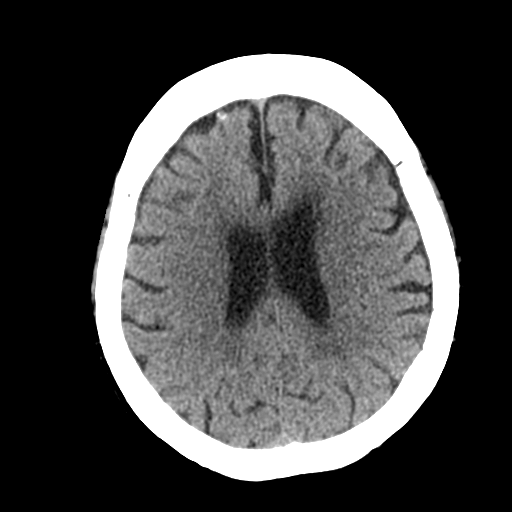
[im 20/28  brain]
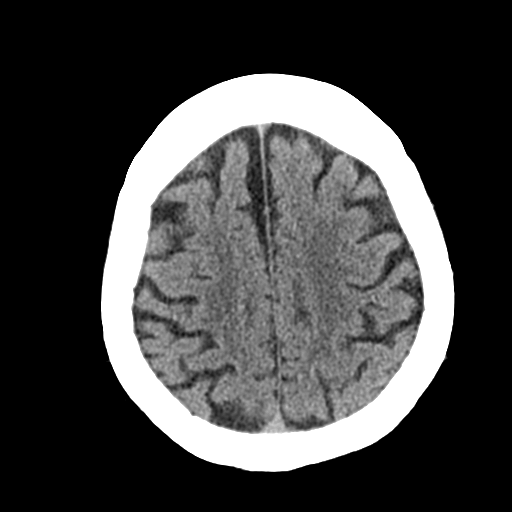
[im 23/28  brain]
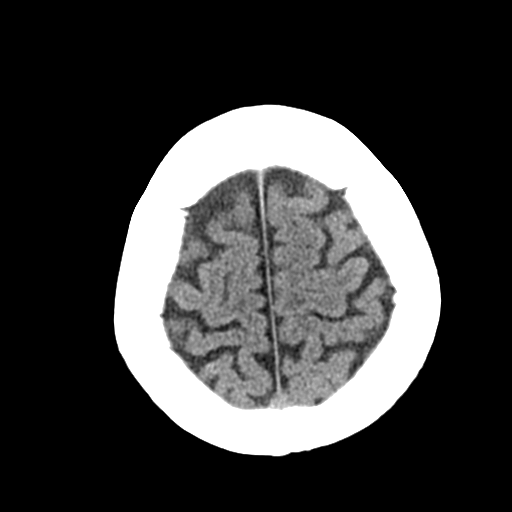
[im 26/28  brain]
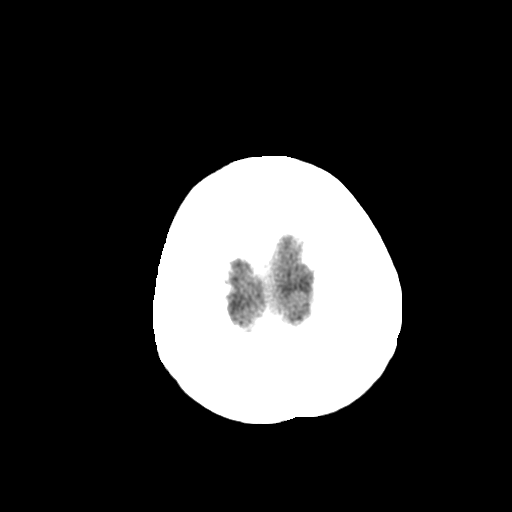
[im 26/28  bone]
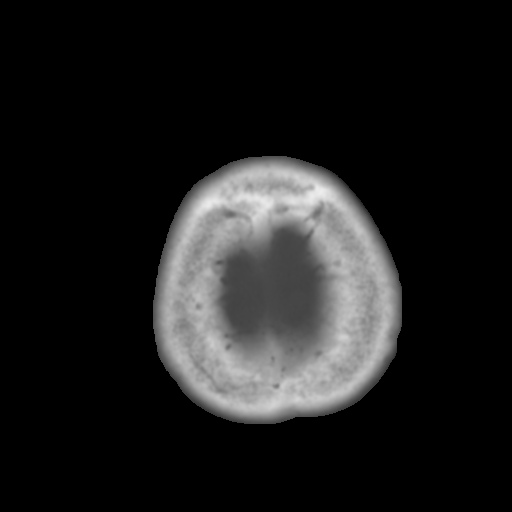

[Series 4: coronal soft tissue · coronal · 0.29mm/px · 3 of 62 slices shown]
[im 21/62  brain]
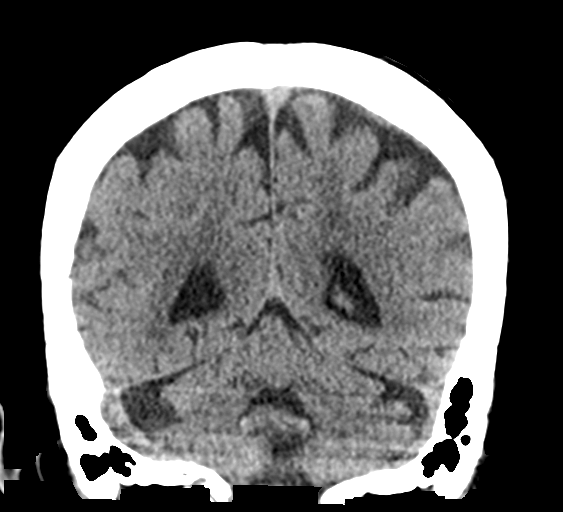
[im 28/62  brain]
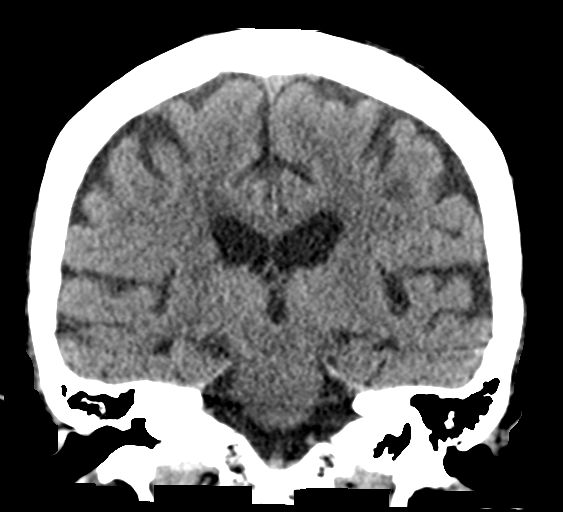
[im 34/62  brain]
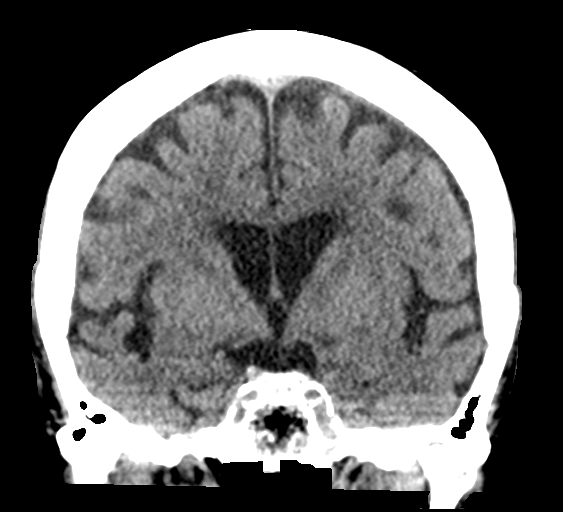

[Series 5: sagittal soft tissue · sagittal · 0.27mm/px · 3 of 50 slices shown]
[im 17/50  brain]
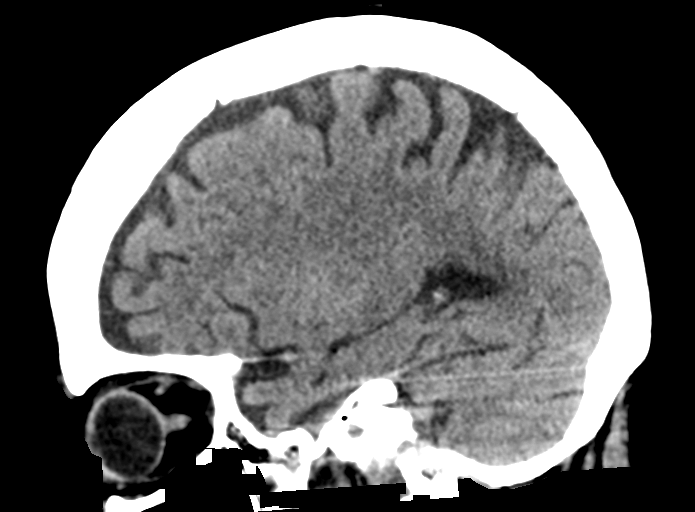
[im 25/50  brain]
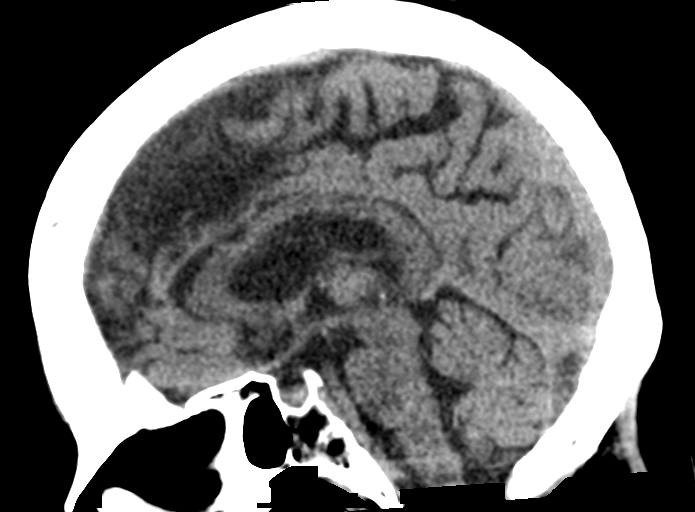
[im 33/50  brain]
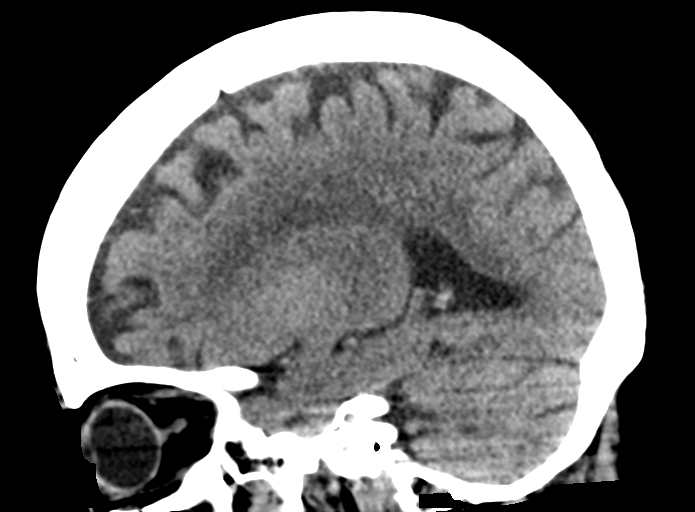

[15 of 45 positions shown; findings below may reference images not displayed]

FINDINGS: Brain: There is low attenuation within the subcortical and
periventricular white matter compatible with chronic microvascular
disease. Chronic infarct within the left cerebellar hemisphere
noted. There is no abnormal extra-axial fluid collection,
intracranial hemorrhage or mass. No evidence for acute brain
infarct.

Vascular: No hyperdense vessel or unexpected calcification.

Skull: The osseous skull is intact.

Sinuses/Orbits: Mucosal thickening involving the left maxillary
sinus. The remaining paranasal sinuses and the mastoid air cells are
clear.

Other: Gas is identified within the soft tissues overlying the right
side of face.
IMPRESSION: 1. No acute intracranial abnormalities.
2. Chronic microvascular disease

## 2018-09-09 ENCOUNTER — Telehealth: Payer: Self-pay | Admitting: Physician Assistant

## 2018-09-09 NOTE — Telephone Encounter (Signed)
Called regarding overdue pacemaker check.  Daughter who spelled her name Dola Factor, reported that her mother is living permanently again in [place] and has a doctor there checking her PPM, and careing for her.  She will not be returning to the [place]. Patient marked inactive in Paceart.  Francis Dowse, PA-C

## 2019-07-30 ENCOUNTER — Encounter: Payer: Self-pay | Admitting: Cardiology

## 2024-06-19 DEATH — deceased
